# Patient Record
Sex: Female | Born: 1970 | Race: White | Hispanic: No | Marital: Married | Smoking: Current every day smoker
Health system: Southern US, Community
[De-identification: ages and names within clinical notes are randomized; demographics above are authoritative.]

## PROBLEM LIST (undated history)

## (undated) DIAGNOSIS — I10 Essential (primary) hypertension: Secondary | ICD-10-CM

## (undated) DIAGNOSIS — G8929 Other chronic pain: Secondary | ICD-10-CM

## (undated) DIAGNOSIS — F1110 Opioid abuse, uncomplicated: Secondary | ICD-10-CM

## (undated) DIAGNOSIS — M549 Dorsalgia, unspecified: Secondary | ICD-10-CM

## (undated) HISTORY — PX: ABDOMINAL HYSTERECTOMY: SHX81

---

## 2003-01-09 ENCOUNTER — Encounter: Payer: Self-pay | Admitting: *Deleted

## 2003-01-09 ENCOUNTER — Ambulatory Visit (HOSPITAL_COMMUNITY): Admission: RE | Admit: 2003-01-09 | Discharge: 2003-01-09 | Payer: Self-pay | Admitting: *Deleted

## 2003-05-13 ENCOUNTER — Ambulatory Visit (HOSPITAL_COMMUNITY): Admission: AD | Admit: 2003-05-13 | Discharge: 2003-05-13 | Payer: Self-pay | Admitting: *Deleted

## 2003-05-18 ENCOUNTER — Inpatient Hospital Stay (HOSPITAL_COMMUNITY): Admission: AD | Admit: 2003-05-18 | Discharge: 2003-05-21 | Payer: Self-pay | Admitting: *Deleted

## 2003-06-18 ENCOUNTER — Ambulatory Visit (HOSPITAL_COMMUNITY): Admission: RE | Admit: 2003-06-18 | Discharge: 2003-06-18 | Payer: Self-pay | Admitting: *Deleted

## 2004-02-19 ENCOUNTER — Ambulatory Visit (HOSPITAL_COMMUNITY): Admission: RE | Admit: 2004-02-19 | Discharge: 2004-02-19 | Payer: Self-pay | Admitting: Family Medicine

## 2004-03-25 ENCOUNTER — Ambulatory Visit (HOSPITAL_COMMUNITY): Admission: RE | Admit: 2004-03-25 | Discharge: 2004-03-25 | Payer: Self-pay | Admitting: Family Medicine

## 2004-04-18 ENCOUNTER — Ambulatory Visit (HOSPITAL_COMMUNITY): Admission: RE | Admit: 2004-04-18 | Discharge: 2004-04-18 | Payer: Self-pay | Admitting: Family Medicine

## 2004-06-15 ENCOUNTER — Emergency Department (HOSPITAL_COMMUNITY): Admission: EM | Admit: 2004-06-15 | Discharge: 2004-06-16 | Payer: Self-pay | Admitting: Emergency Medicine

## 2004-11-01 ENCOUNTER — Ambulatory Visit (HOSPITAL_COMMUNITY): Admission: RE | Admit: 2004-11-01 | Discharge: 2004-11-01 | Payer: Self-pay | Admitting: Family Medicine

## 2004-11-14 ENCOUNTER — Ambulatory Visit (HOSPITAL_COMMUNITY): Admission: RE | Admit: 2004-11-14 | Discharge: 2004-11-14 | Payer: Self-pay | Admitting: Family Medicine

## 2004-11-14 ENCOUNTER — Ambulatory Visit (HOSPITAL_COMMUNITY): Admission: RE | Admit: 2004-11-14 | Discharge: 2004-11-14 | Payer: Self-pay | Admitting: *Deleted

## 2004-12-28 ENCOUNTER — Inpatient Hospital Stay (HOSPITAL_COMMUNITY): Admission: RE | Admit: 2004-12-28 | Discharge: 2004-12-30 | Payer: Self-pay | Admitting: *Deleted

## 2005-06-07 ENCOUNTER — Emergency Department (HOSPITAL_COMMUNITY): Admission: EM | Admit: 2005-06-07 | Discharge: 2005-06-08 | Payer: Self-pay | Admitting: Emergency Medicine

## 2005-07-27 ENCOUNTER — Emergency Department (HOSPITAL_COMMUNITY): Admission: EM | Admit: 2005-07-27 | Discharge: 2005-07-27 | Payer: Self-pay | Admitting: Emergency Medicine

## 2005-08-22 ENCOUNTER — Encounter: Admission: RE | Admit: 2005-08-22 | Discharge: 2005-08-22 | Payer: Self-pay | Admitting: Neurological Surgery

## 2005-12-04 IMAGING — CR DG LUMBAR SPINE COMPLETE 4+V
5 series · 5 of 5 positions shown · non-contrast
Comparison: none

CLINICAL DATA: 32-year-old with low back pain.
 LUMBAR SPINE COMPLETE
 Five views of the lumbar spine show five non rib-bearing lumbar type vertebral bodies.  Normal alignment on the lateral film.  Disk spaces and vertebral bodies are maintained.  Oblique films demonstrate normal facet joints and no pars defects.  There is mild sclerotic change at the SI joints, which could be degenerative changes or may be osteitis condensans ilii.  
 IMPRESSION
 1.  No acute bony findings and normal alignment.
 2.  Mild sclerosis surrounding the SI joints may be due to degenerative disease or osteitis condensans ilii.

[view not recorded (1 of 5)]
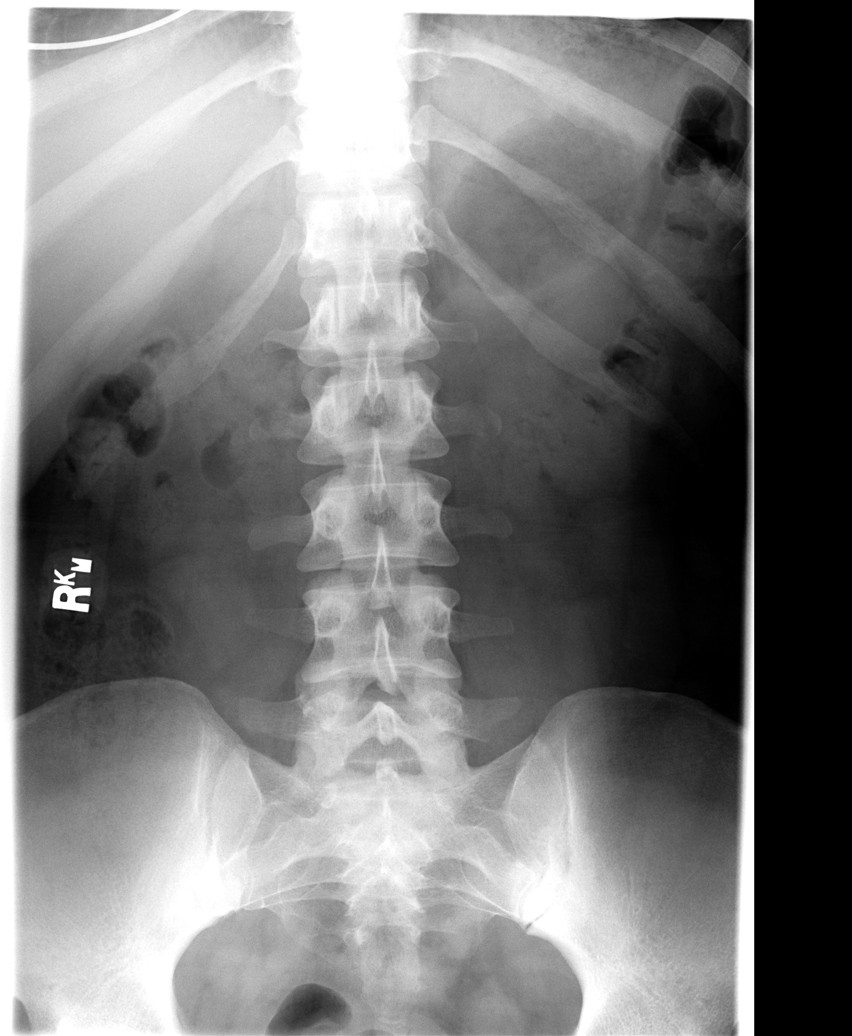

[view not recorded (2 of 5)]
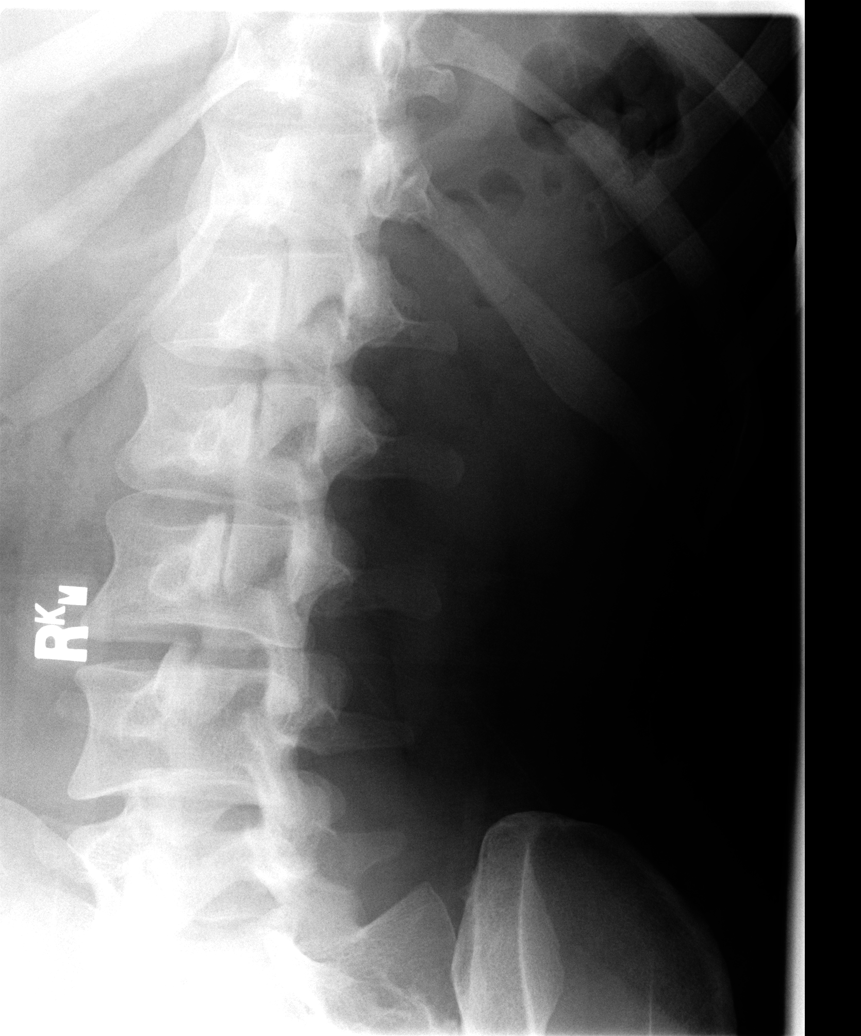

[view not recorded (3 of 5)]
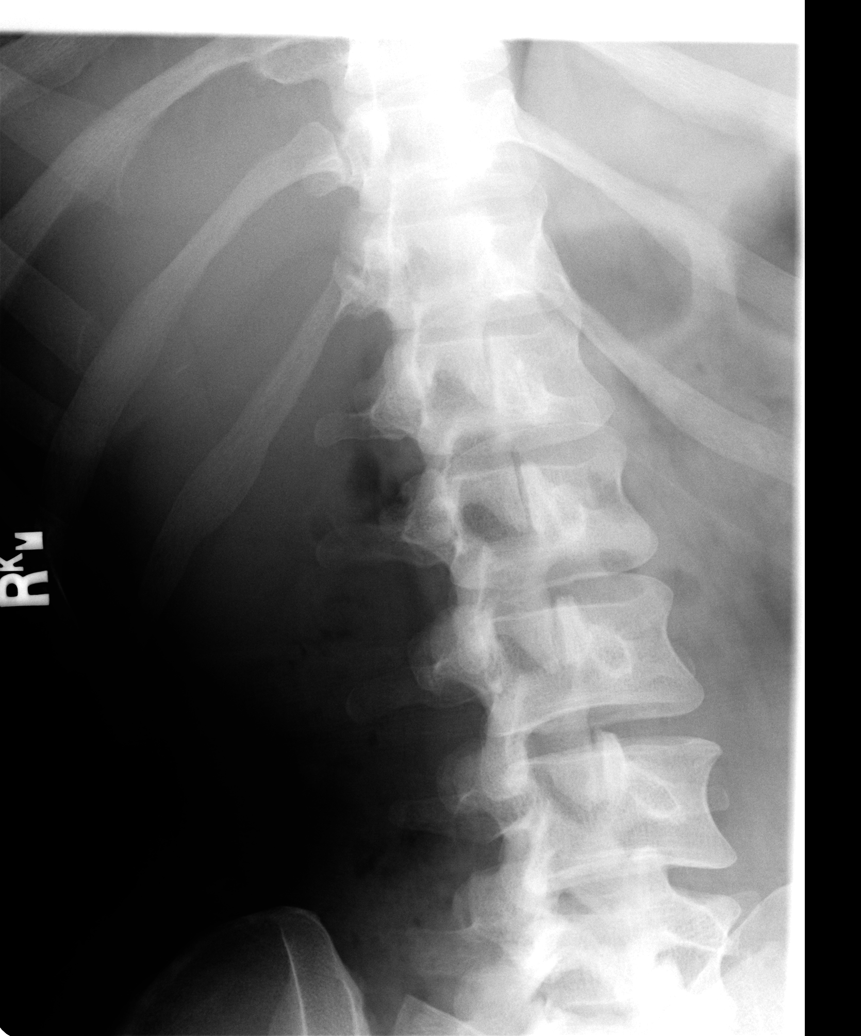

[view not recorded (4 of 5)]
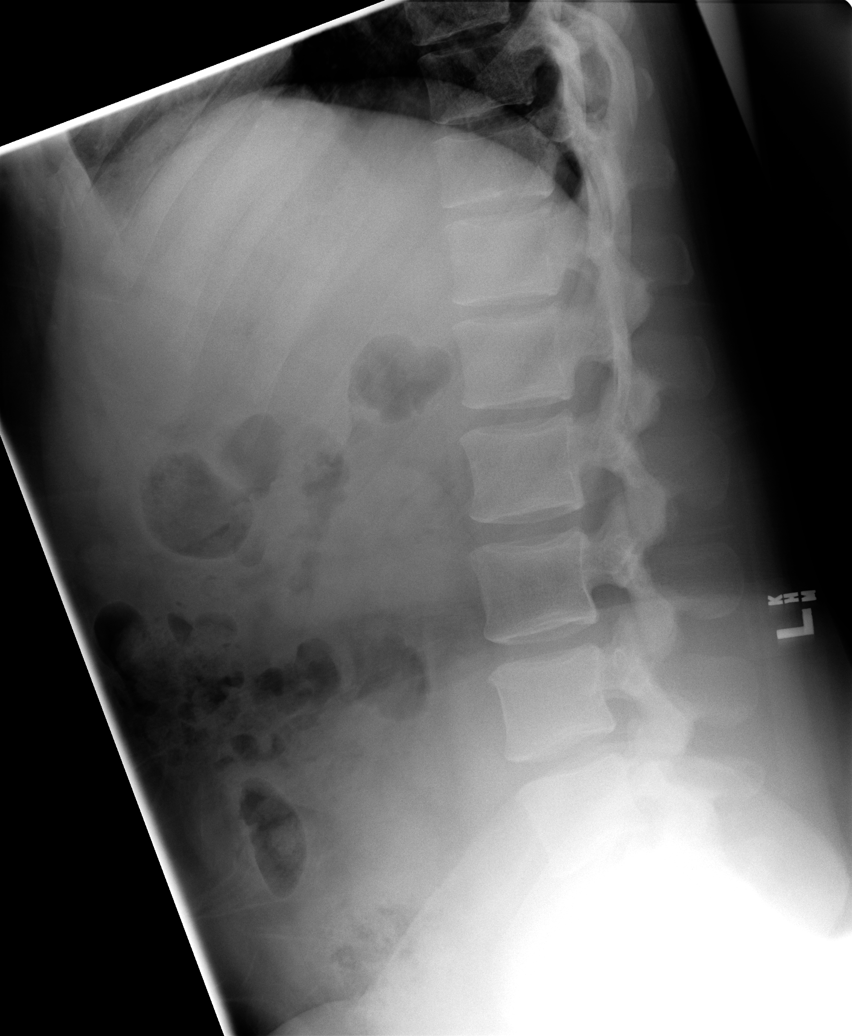

[view not recorded (5 of 5)]
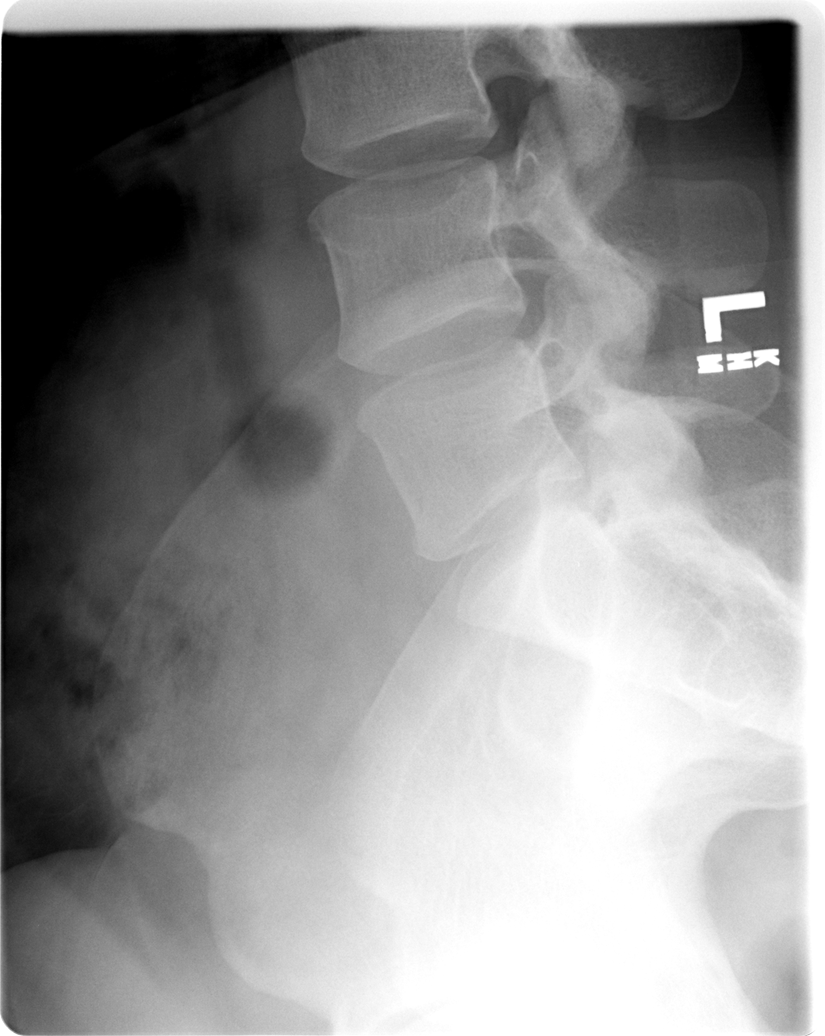

[5 of 5 positions shown; findings below may reference images not displayed]

## 2006-04-24 ENCOUNTER — Encounter (HOSPITAL_COMMUNITY): Admission: RE | Admit: 2006-04-24 | Discharge: 2006-05-24 | Payer: Self-pay | Admitting: Family Medicine

## 2006-10-19 ENCOUNTER — Emergency Department (HOSPITAL_COMMUNITY): Admission: EM | Admit: 2006-10-19 | Discharge: 2006-10-19 | Payer: Self-pay | Admitting: Emergency Medicine

## 2006-10-30 ENCOUNTER — Ambulatory Visit (HOSPITAL_COMMUNITY): Admission: RE | Admit: 2006-10-30 | Discharge: 2006-10-30 | Payer: Self-pay | Admitting: Family Medicine

## 2007-01-11 ENCOUNTER — Emergency Department (HOSPITAL_COMMUNITY): Admission: EM | Admit: 2007-01-11 | Discharge: 2007-01-11 | Payer: Self-pay | Admitting: Emergency Medicine

## 2007-12-15 ENCOUNTER — Emergency Department (HOSPITAL_COMMUNITY): Admission: EM | Admit: 2007-12-15 | Discharge: 2007-12-15 | Payer: Self-pay | Admitting: Emergency Medicine

## 2008-04-04 ENCOUNTER — Emergency Department: Payer: Self-pay | Admitting: Emergency Medicine

## 2008-04-27 ENCOUNTER — Emergency Department: Payer: Self-pay | Admitting: Emergency Medicine

## 2008-04-28 ENCOUNTER — Emergency Department: Payer: Self-pay | Admitting: Internal Medicine

## 2008-04-30 ENCOUNTER — Emergency Department: Payer: Self-pay | Admitting: Emergency Medicine

## 2008-09-21 ENCOUNTER — Emergency Department (HOSPITAL_COMMUNITY): Admission: EM | Admit: 2008-09-21 | Discharge: 2008-09-21 | Payer: Self-pay | Admitting: Emergency Medicine

## 2008-11-06 ENCOUNTER — Emergency Department (HOSPITAL_COMMUNITY): Admission: EM | Admit: 2008-11-06 | Discharge: 2008-11-06 | Payer: Self-pay | Admitting: Emergency Medicine

## 2008-12-13 ENCOUNTER — Emergency Department (HOSPITAL_COMMUNITY): Admission: EM | Admit: 2008-12-13 | Discharge: 2008-12-13 | Payer: Self-pay | Admitting: Emergency Medicine

## 2009-03-21 ENCOUNTER — Emergency Department (HOSPITAL_COMMUNITY): Admission: EM | Admit: 2009-03-21 | Discharge: 2009-03-21 | Payer: Self-pay | Admitting: Emergency Medicine

## 2009-04-14 ENCOUNTER — Emergency Department (HOSPITAL_COMMUNITY): Admission: EM | Admit: 2009-04-14 | Discharge: 2009-04-14 | Payer: Self-pay | Admitting: Emergency Medicine

## 2009-06-06 ENCOUNTER — Emergency Department (HOSPITAL_COMMUNITY): Admission: EM | Admit: 2009-06-06 | Discharge: 2009-06-06 | Payer: Self-pay | Admitting: Emergency Medicine

## 2009-11-06 ENCOUNTER — Emergency Department (HOSPITAL_COMMUNITY): Admission: EM | Admit: 2009-11-06 | Discharge: 2009-11-06 | Payer: Self-pay | Admitting: Emergency Medicine

## 2010-09-21 ENCOUNTER — Emergency Department (HOSPITAL_COMMUNITY): Admission: EM | Admit: 2010-09-21 | Discharge: 2010-09-21 | Payer: Self-pay | Admitting: Emergency Medicine

## 2010-12-31 ENCOUNTER — Encounter: Payer: Self-pay | Admitting: *Deleted

## 2010-12-31 ENCOUNTER — Encounter: Payer: Self-pay | Admitting: Family Medicine

## 2011-01-01 ENCOUNTER — Encounter: Payer: Self-pay | Admitting: *Deleted

## 2011-02-22 LAB — COMPREHENSIVE METABOLIC PANEL
ALT: 18 U/L (ref 0–35)
AST: 20 U/L (ref 0–37)
Albumin: 4.2 g/dL (ref 3.5–5.2)
Alkaline Phosphatase: 62 U/L (ref 39–117)
BUN: 5 mg/dL — ABNORMAL LOW (ref 6–23)
CO2: 25 mEq/L (ref 19–32)
Calcium: 9.3 mg/dL (ref 8.4–10.5)
Chloride: 105 mEq/L (ref 96–112)
Creatinine, Ser: 0.71 mg/dL (ref 0.4–1.2)
GFR calc Af Amer: >60 mL/min (ref >60)
GFR calc non Af Amer: >60 mL/min (ref >60)
Glucose, Bld: 90 mg/dL (ref 70–99)
Potassium: 3.7 mEq/L (ref 3.5–5.1)
Sodium: 139 mEq/L (ref 135–145)
Total Bilirubin: 0.6 mg/dL (ref 0.3–1.2)
Total Protein: 7.9 g/dL (ref 6.0–8.3)

## 2011-02-22 LAB — DIFFERENTIAL
Basophils Absolute: 0 10*3/uL (ref 0.0–0.1)
Basophils Relative: 0 % (ref 0–1)
Eosinophils Absolute: 0.2 10*3/uL (ref 0.0–0.7)
Eosinophils Relative: 2 % (ref 0–5)
Lymphocytes Relative: 18 % (ref 12–46)
Lymphs Abs: 2.3 10*3/uL (ref 0.7–4.0)
Monocytes Absolute: 0.6 10*3/uL (ref 0.1–1.0)
Monocytes Relative: 5 % (ref 3–12)
Neutro Abs: 9.8 10*3/uL — ABNORMAL HIGH (ref 1.7–7.7)
Neutrophils Relative %: 75 % (ref 43–77)

## 2011-02-22 LAB — CBC
HCT: 40.1 % (ref 36.0–46.0)
Hemoglobin: 13.2 g/dL (ref 12.0–15.0)
MCH: 31.4 pg (ref 26.0–34.0)
MCHC: 33 g/dL (ref 30.0–36.0)
MCV: 95 fL (ref 78.0–100.0)
Platelets: 217 10*3/uL (ref 150–400)
RBC: 4.22 MIL/uL (ref 3.87–5.11)
RDW: 14.3 % (ref 11.5–15.5)
WBC: 13.1 10*3/uL — ABNORMAL HIGH (ref 4.0–10.5)

## 2011-02-22 LAB — URINALYSIS, ROUTINE W REFLEX MICROSCOPIC
Bilirubin Urine: NEGATIVE
Glucose, UA: NEGATIVE mg/dL
Hgb urine dipstick: NEGATIVE
Ketones, ur: NEGATIVE mg/dL
Nitrite: NEGATIVE
Protein, ur: NEGATIVE mg/dL
Specific Gravity, Urine: <1.005 — ABNORMAL LOW (ref 1.005–1.030)
Urobilinogen, UA: 0.2 mg/dL (ref 0.0–1.0)
pH: 6 (ref 5.0–8.0)

## 2011-02-22 LAB — URINE CULTURE
Colony Count: 8000
Culture  Setup Time: 201110130156

## 2011-03-15 LAB — CULTURE, ROUTINE-ABSCESS: Gram Stain: NONE SEEN

## 2011-03-21 LAB — COMPREHENSIVE METABOLIC PANEL
ALT: 33 U/L (ref 0–35)
AST: 23 U/L (ref 0–37)
Alkaline Phosphatase: 67 U/L (ref 39–117)
CO2: 22 mEq/L (ref 19–32)
GFR calc Af Amer: >60 mL/min (ref >60)
GFR calc non Af Amer: >60 mL/min (ref >60)
Glucose, Bld: 116 mg/dL — ABNORMAL HIGH (ref 70–99)
Potassium: 3.2 mEq/L — ABNORMAL LOW (ref 3.5–5.1)
Sodium: 135 mEq/L (ref 135–145)
Total Protein: 6.6 g/dL (ref 6.0–8.3)

## 2011-03-21 LAB — RAPID URINE DRUG SCREEN, HOSP PERFORMED
Amphetamines: NOT DETECTED
Benzodiazepines: NOT DETECTED
Tetrahydrocannabinol: NOT DETECTED

## 2011-03-21 LAB — URINALYSIS, ROUTINE W REFLEX MICROSCOPIC
Bilirubin Urine: NEGATIVE
Hgb urine dipstick: NEGATIVE
Ketones, ur: NEGATIVE mg/dL
Protein, ur: NEGATIVE mg/dL
Urobilinogen, UA: 1 mg/dL (ref 0.0–1.0)

## 2011-03-21 LAB — DIFFERENTIAL
Basophils Relative: 0 % (ref 0–1)
Eosinophils Absolute: 0 10*3/uL (ref 0.0–0.7)
Eosinophils Relative: 0 % (ref 0–5)
Monocytes Relative: 5 % (ref 3–12)
Neutrophils Relative %: 87 % — ABNORMAL HIGH (ref 43–77)

## 2011-03-21 LAB — CBC
Hemoglobin: 13.8 g/dL (ref 12.0–15.0)
RBC: 4.12 MIL/uL (ref 3.87–5.11)
WBC: 14 10*3/uL — ABNORMAL HIGH (ref 4.0–10.5)

## 2011-04-28 NOTE — Discharge Summary (Signed)
   NAME:  Molly Munoz, Molly Munoz                        ACCOUNT NO.:  [id number]   MEDICAL RECORD NO.:  [id number]                   PATIENT TYPE:  AMB   LOCATION:  DAY                                  FACILITY:  [facility]   PHYSICIAN:  Langley Gauss, M.D.                DATE OF BIRTH:  Jul 03, 1971   DATE OF ADMISSION:  06/18/2003  DATE OF DISCHARGE:  06/18/2003                                 DISCHARGE SUMMARY   OUTPATIENT SURGERY PERFORMED:  Laparoscopy tubal ligation, lysing bipolar  electrocautery.   DISPOSITION:  The patient; is to followup in the office  in 1 week's time  for postoperative evaluation.  She is given a copy of the  standardized  discharge instructions.   DISCHARGE MEDICATIONS:  She has previously obtained a prescription  preoperatively for Lortab 10/500.   PERTINENT LABORATORY STUDIES:  AB positive blood type, hCG quantitative is 6  which represents the 4 week postpartum state and declining hCG values.  Hemoglobin 12.6, hematocrit 36.9.  Electrolytes within normal limits with  sodium 137, potassium 3.9, chloride 109, CO2 is 25.   HOSPITAL COURSE:  Patient taken to the ambulatory surgical unit on June 18, 2003 with laparoscopic tubal ligation lysing bipolar cautery was performed  without complications.  Postoperatively the patient did well.  She satisfied  all postoperative criteria prior to discharge on the same day of service.  Operative findings discussed with the patient's awaiting husband.                                               Langley Gauss, M.D.    DC/MEDQ  D:  06/21/2003  T:  06/22/2003  Job:  [id number]

## 2011-04-28 NOTE — Op Note (Signed)
   NAMEJANIT, CUTTER NO.:  [id number]   MEDICAL RECORD NO.:  [id number]                  PATIENT TYPE:   LOCATION:                                       FACILITY:   PHYSICIAN:  Langley Gauss, M.D.                DATE OF BIRTH:   DATE OF PROCEDURE:  05/18/2003  DATE OF DISCHARGE:                                 OPERATIVE REPORT   PROCEDURE:  Placement of Foley bulb for mechanical ripening of the cervix  prior to induction.   SURGEON:  Langley Gauss, M.D.   COMPLICATIONS:  None.   SUMMARY:  The planned procedure had been discussed with the patient both  prenatally and again on today's visit.  Nonstress test was reactive prior to  the procedure, with accelerations noted.  No uterine contractions were  identified.  Normal uterine tone.  No leakage of fluid.  No vaginal  bleeding.  The patient was placed in the dorsal lithotomy position.  The  cervix was visualized and noted to be visually only about fingertip dilated.  No abnormal discharge was identified.  A 16 French Foley catheter was then  easily placed through the patent endocervical os and into the lower uterine  segment at which time it was inflated to a volume of 50 cc of sterile normal  saline.  This was then taped to the patient's leg under gentle traction, and  the speculum was then removed.  The patient was again placed on the external  fetal monitor.  The nonstress test remained reactive.  No uterine  contractions were identified.  No change in uterine tone.  No leakage of  fluid and no vaginal bleeding.   Just prior to placement of the Foley, the patient's blood pressure was noted  to be 162/103 which was consistent with the indication for induction despite  the unfavorable nature of the cervix.                                               Langley Gauss, M.D.    DC/MEDQ  D:  05/18/2003  T:  05/18/2003  Job:  [id number]

## 2011-04-28 NOTE — Op Note (Signed)
NAME:  BRENYA, TAULBEE                        ACCOUNT NO.:  [id number]   MEDICAL RECORD NO.:  [id number]                   PATIENT TYPE:  INP   LOCATION:  A418                                 FACILITY:  [facility]   PHYSICIAN:  Langley Gauss, M.D.                DATE OF BIRTH:  Jul 15, 1971   DATE OF PROCEDURE:  05/18/2003  DATE OF DISCHARGE:                                 OPERATIVE REPORT   DELIVERY NOTE:   DIAGNOSES:  1. A 38-week intrauterine pregnancy  2. History of hypertension.   DELIVERY PERFORMED:  Spontaneous assisted vaginal delivery of a 4 pound 12  ounce growth retarded female infant delivered over an intact perineum.  Delivery performed by Dr. Roylene Reason. Lisette Grinder.   ESTIMATED BLOOD LOSS:  Less than 500 cc.   COMPLICATIONS:  None.   SPECIMENS:  1. Arterial cord gas and cord blood to pathology laboratory.  2. Placenta is examined and noted to be intake with a 3-vessel umbilical     cord.  A thin umbilical cord is noted, no gross abnormalities noted of     the placenta itself.   ANALGESIC:  Continuous lumbar epidural analgesia placed by Dr. Roylene Reason.  Lisette Grinder and managed throughout the course of labor.   SUMMARY:  The patient is admitted for induction of labor.  Due to the  unfavorable of the cervix a Foley bulb was placed initially for cervical  ripening of the cervix.  With onset of active uterine contractions after  amniotomy the patient requested epidural.  Epidural functioned very well  throughout the labor course. However, the patient thereafter progressed very  rapidly to reach complete dilatation.   She was placed in the dorsal lithotomy position.  She had a short second  stage of labor to deliver in a direct OA position over the intact perineum.  The mouth and nares were bulb suctioned of clear amniotic fluid.  Renewed  expulsive efforts resulted in a spontaneous rotation to a right anterior  shoulder position.  Gentle downward traction resulted in delivery  of the  shoulder as well as the remainder of the infant without difficulty.  The  umbilical cord is milked towards the infant.  The cord is doubly clamped,  and cut.  The infant is taken to the nursing table for immediate assessment.  A spontaneous, vigorous breathing cry is noted.  Arterial cord gases and  cord blood are obtained from the umbilical cord.  Gentle traction on the  umbilical cord results in separation which, upon examination, is intact 3-  vessel placenta.   Examination following delivery reveals an intact perineum.  No lacerations  have occurred and excellent uterine tone is achieved. The patient is taken  out of dorsal lithotomy position and rolled to her side, at which time, the  epidural catheter is removed with the blue tip noted to be intact.  Langley Gauss, M.D.    DC/MEDQ  D:  05/18/2003  T:  05/19/2003  Job:  [id number]

## 2011-04-28 NOTE — Op Note (Signed)
   NAME:  Molly Munoz, Molly Munoz                        ACCOUNT NO.:  [id number]   MEDICAL RECORD NO.:  [id number]                   PATIENT TYPE:  INP   LOCATION:  A418                                 FACILITY:  [facility]   PHYSICIAN:  Langley Gauss, M.D.                DATE OF BIRTH:  08-Oct-1971   DATE OF PROCEDURE:  05/18/2003  DATE OF DISCHARGE:                                 OPERATIVE REPORT   PROCEDURE:  Placement of continuous lumbar epidural analgesia at the L2-3  interspace performed by Dr. Roylene Reason. Lisette Grinder.   COMPLICATIONS:  None.   SPECIMENS:  None.   SUMMARY:  An appropriate informed consent was obtained, patient placed in  the seated position, bony landmarks were identified. The L3-4 interspace was  chosen. The patient's back was sterilely prepped and draped utilizing the  epidural tray. 5 cc of 1% lidocaine plain injected at the midline of the L3-  4 interspace through a small skin wheal. The 17 gauge Tuohy-Schiff needle  was then utilized with loss of resistance and an air filled syringe to  identify entry into the epidural space on the first attempt without  difficulty. Initial test dose of 5 cc 1.5% lidocaine plus epinephrine  injected through the epidural needle, no signs of CSF or intravascular  injection obtain. The epidural catheter was then inserted to a depth of 5  cm, epidural needle was removed, aspiration test was negative. A second test  dose of 2 ml 1.5% lidocaine plus epinephrine injected through the epidural  catheter, again no signs of CSF or intravascular injection obtained. The  patient is having tingling in the feet consistent with a properly setting  epidural block. The patient is then connected to the infusion pump  containing the standard mixture. She will be treated with a bolus of 10 cc  followed by a continuous infusion of about 18 cc/hour. Upon return to the  lateral supine position, the patient is noted to continue to have a  reassuring fetal heart  rate, cervical examination 4 cm dilated, completed  effaced with the vertex at a plus 1 station continuing with a reassuring  fetal heart rate. The patient does have evidence at this time of a  functional bilateral block and thus should have adequate labor analgesia.                                               Langley Gauss, M.D.    DC/MEDQ  D:  05/18/2003  T:  05/18/2003  Job:  [id number]

## 2011-04-28 NOTE — Op Note (Signed)
Molly Munoz, Molly Munoz              ACCOUNT NO.:  [id number]   MEDICAL RECORD NO.:  [id number]          PATIENT TYPE:  INP   LOCATION:  A410                          FACILITY:  [facility]   PHYSICIAN:  Langley Gauss, MD     DATE OF BIRTH:  Jan 04, 1971   DATE OF PROCEDURE:  DATE OF DISCHARGE:                                 OPERATIVE REPORT   PREOPERATIVE DIAGNOSES:  1.  Menometrorrhagia.  2.  Dysmenorrhea.  3.  Bilateral ovarian cysts with bilateral adnexal pain.   POSTOPERATIVE DIAGNOSES:  1.  Menometrorrhagia.  2.  Dysmenorrhea.  3.  Bilateral ovarian cysts with bilateral adnexal pain.   PROCEDURE:  Total abdominal hysterectomy, bilateral salpingo-oophorectomy.   SURGEON:  Roylene Reason. Lisette Grinder, MD   ESTIMATED BLOOD LOSS:  200 mL.   ANALGESIA:  General endotracheal.   SPECIMEN:  For permanent section only.   DRAINS:  Two JP drains are placed in the subcutaneous space, a Foley  catheter sterilely draining clear yellow urine.   SUMMARY:  An appropriate informed consent was obtained.  I discussed the  planned procedure with the patient immediately preoperatively in the holding  area. The is then taken to the operating room.  Vital signs are stable.  She  underwent uncomplicated induction of general anesthesia.  She is prepped and  draped in the usual sterile manner.  Foley catheter is sterilely placed to  straight drainage, clear yellow urine is noted.  After the abdominal area is  prepped, a knife is used to incise an elliptical-shaped Pfannenstiel  incision; dissected down to the fascial plane utilizing the knife and  cauterizing bleeders along the way.   The fascia is then incised in a transverse curvilinear manner while  dissecting off the underlying rectus muscles.  Rectus fascia is then grasped  with straight Kocher clamps. The fascia is dissected off the underlying  rectus muscle in the midline, superiorly and inferiorly.  The rectus muscles  are bluntly separated.  The  peritoneal cavity is atraumatically and bluntly  entered at the superior most portion of the incision.  Peritoneal incision  extended superiorly and inferiorly; inferiorly I directly palpated the  bladder to avoid its accidental injury.   A Balfour self-retaining retractor is placed.  Blades are noted to be  somewhat deep and 2 packs are utilized to elevate the Balfour retractor to  avoid the blades directing on the femoral nerve.  Two moist packs are then  placed to mobilize bowel out of the pelvis.  Bladder blades, also, were  placed.  The specimen was identified.  It was noted to be freely mobile.  There was a prominent fundal portion to the uterus.  The ovaries are noted  to be slightly enlarged bilaterally.  There is evidence of prior tubal  ligation.  No adhesive disease is encountered.  Long straight Kocher clamps  were used to grasp the round ligament and fallopian tube where they joined  the uterus.  The right round ligament is ligated first.  Kelly clamp was  placed to control backbleeding, the #0 Vicryl suture in a Heaney fashion was  then placed.  The right round ligament was transected. This allows  skeletization of the infundibulopelvic ligament in the avascular plane.  After skeletization, 2 Kelly clamps were placed very close to the ovary  itself.  Transection was performed between the two.  The infundibulopelvic  ligament was then doubly ligated first with a #0 Vicryl free-tie, followed  by suture ligature of #0 Vicryl in a Heaney fashion.  This secures  hemostasis.  Identical procedure is performed on the left.  The left round  ligament is transected with #0 Vicryl suture in a Heaney fashion.  The  infundibulopelvic ligament, on the left, is skeletonized; it is doubly  clamped and then doubly ligated first with a free-tie of #0 Vicryl followed  by suture ligature of #0 Vicryl in a Heaney fashion.  This likewise secures  hemostasis.   We then continued the dissection  across the anterior lower uterine segment  which allows skeletization of each of the uterine vessels and creates a  bladder flap from the vesicouterine folds.  This is then, bluntly separated  off of the anterior lower uterine segment.  The right uterine vessel is  ligated first.  A Kelly clamp was placed to control backbleeding.  A curved  Heaney clamp was then placed at the junction of the body of the uterus with  the cervix itself.  This pedicle was then ligated with a #0 Vicryl suture to  secure hemostasis.  The left uterine vessel is likely clamped with a curved  Heaney clamp, followed by a suture ligature of #0 Vicryl.  This now secures  the major vascular supply to the uterus bilaterally and significant  blanching is noted to occur due to devascularization of the fundal portion  of the uterus.  The right cardinal ligament is identified.  A straight  Heaney clamp is placed here, placing it very close to the cervix as  possible.  It is then ligated with #0 Vicryl in a Heaney fashion.  The left  cardinal ligament, likewise, clamped with a straight Heaney clamp followed  by #0 Vicryl suture in a Heaney fashion.   This then brought me down to the uterosacral ligaments.  The right  uterosacral ligament was clamped with a curved Heaney clamp followed by  suture ligature of #0 Vicryl in a Heaney fashion.  This was secured. This  pedicle was then tagged for a later inspection.  The left ureterosacral  ligament is likewise clamped with a curved Heaney clamp followed by suture  ligature of #0 Vicryl in a Heaney fashion.  This suture is also tagged for  later inspection.  At his point in time, I was able to dissect across the  vaginal cuff maintaining maximal depth of vagina while removing the entirety  of the cervix.   The specimen is then removed.  The cervix is examined.  The cervix is noted  to be contained in its entirety with the operative specimen.  The edges of the of the vaginal cuff  were then grasped with straight Kocher clamps.  Irrigation of the upper vagina is performed with a Betadine solution.  The  vaginal cuff is then closed with figure-of-eight sutures of #0 Vicryl, 3 are  placed.  This secures hemostasis and results in a very secure, and well-  supported closure.  At this point in time, all of our pedicles are examined.  There is a small amount of bleeding occurring from the free peritoneal edges  between each of the round ligaments  and the vaginal cuff.  A running 2-0  Vicryl suture is placed across the vaginal cuff to pull the bladder flap  over our suture line.  This, likewise, assists with hemostasis.  On each  side, then, a 2-0 Vicryl suture is placed with very superficial stitches  from each of the round ligaments to the vaginal cuff.  Again, all sutures  are placed, very superficially, to avoid any ureteral injury.  Hemostasis is  now assured.  Copious irrigation is performed until clear.  The ureters are  noted to be in their normal anatomic position along the lateral pelvic side  walls, thus the closure is performed.   The Balfour retractor is removed as well as the moist packs.  The peritoneal  edges are grasped using Kelly clamp. The peritoneum and overlying rectus  muscle are then closed with a continuous running #0 Vicryl suture.  The  subcutaneous bleeders are cauterized.  The fascia is then closed with a  continuous running #0 looped PDS suture.  The subcutaneous bleeders are  cauterized.  Two JP drains are placed in the subcutaneous space; one to the  right and the other to the left of the uterine incision.  These are sutured  into place with separate sutures with subcutaneous hemostasis assured, 3  horizontal mattress sutures of #1 PDS are placed which help with  reapproximation of the skin edges.  The skin is then completely closed  utilizing skin staples.  To facilitate postoperative analgesia a total of 30  mL of 0.5% bupivacaine plain is  injected along the skin incision to raise  small skin wheals.  The procedure is then terminated.  Vital signs have  remained stable.  The patient continues to drain clear yellow urine.  She is  extubated, taken to the recovery room in stable condition, at which time  operative findings discussed with the patient's awaiting family.      DC/MEDQ  D:  12/28/2004  T:  12/28/2004  Job:  [id number]

## 2011-04-28 NOTE — Discharge Summary (Signed)
NAMEAVO, SCHLACHTER NO.:  [id number]   MEDICAL RECORD NO.:  [id number]          PATIENT TYPE:  INP   LOCATION:  A410                          FACILITY:  [facility]   PHYSICIAN:  Langley Gauss, MD     DATE OF BIRTH:  August 23, 1971   DATE OF ADMISSION:  12/28/2004  DATE OF DISCHARGE:  01/20/2006LH                                 DISCHARGE SUMMARY   PROCEDURES PERFORMED:  December 28, 2004 total abdominal  hysterectomy/bilateral salpingo-oophorectomy.   DISCHARGE MEDICATIONS:  Tylox and Climara patch 0.1 mg daily.   Patient is given copy of standardized discharge instructions.  Follow up in  the office in four days' time for removal of staples from Pfannenstiel  incision.   LABORATORIES:  Admission hemoglobin and hematocrit 11.4/34.2 with a white  count of 11.0.  Postoperative day #1 10.9/32.1 with a white count of 12.1.   HOSPITAL COURSE:  See previous dictations.  TAH/BSO performed December 28, 2004 without complications.  Postoperatively the patient did well.  She  remained afebrile.  Advanced her diet.  Tolerated a regular general diet.  At time of discharge normal bowel function.  Doing well with PCA Dilaudid  which was then converted to p.o. Tylox.  In addition, a Climara patch  adhered well to the patient.  Thus, following uncomplicated postoperative  course patient discharged to home on December 30, 2004.      DC/MEDQ  D:  01/09/2005  T:  01/09/2005  Job:  [id number]

## 2011-04-28 NOTE — H&P (Signed)
   NAME:  Molly Munoz, Molly Munoz                        ACCOUNT NO.:  [id number]   MEDICAL RECORD NO.:  [id number]                   PATIENT TYPE:  AMB   LOCATION:  DAY                                  FACILITY:  [facility]   PHYSICIAN:  Langley Gauss, M.D.                DATE OF BIRTH:  June 13, 1971   DATE OF ADMISSION:  06/18/2003  DATE OF DISCHARGE:                                HISTORY & PHYSICAL   HISTORY OF PRESENT ILLNESS:  A 40 year old gravida 3, para 2 referred to  outpatient surgical unit for a laparoscopic tubal ligation and __________  cautery to be performed on June 18, 2003.  The patient is aware that this is  to be considered an irreversible procedure.  She is also aware of the  inherent 98% success, implicating a 2% failure rate within the first 10  years.  She, likewise, is aware that there are other permanent forms of  birth control available, but, at this time, she adamantly desires permanent  and irreversible sterilization.   PAST MEDICAL AND SURGICAL HISTORY:  1. In 1991, spontaneous AB at [redacted] weeks gestation.  2. In July 1993, vaginal delivery of a 6 pound 3.5 ounce infant.  3. In July 2004, vaginal delivery of a 12 pound 12 ounce infant.   She does smoke 2 packs per day.  Currently bottlefeeding in a postpartum  state.   ALLERGIES:  She has no known drug allergies.   She does have chronic hypertension for which she takes Diovan 160/12.5  daily.   PHYSICAL EXAMINATION:  GENERAL:  In no acute distress.  VITAL SIGNS:  Height 5 feet 7 inches, 189, 142/84, pulse 80, respiratory  rate 20.  HEENT:  Negative.  No adenopathy.  NECK:  Supple.  Thyroid is not palpable.  LUNGS:  Clear.  CARDIOVASCULAR:  Regular rate and rhythm.  ABDOMEN:  Soft and nontender.  No surgical scars are identified.  No  abdominal or pelvic masses appreciated.  EXTREMITIES:  Noted to be normal.  PELVIC:  Normal external genitalia.  No lesions or ulcerations identified.  Cervix is without lesions.   Uterus noted to be multiparous in size,  anteflexed, with no adnexal masses.  Uterus is fully mobile.   ASSESSMENT:  A 40 year old gravida 3, para 2, 4 weeks postpartum who  adamantly desires current sterilization to be performed on June 18, 2003 on  an outpatient basis.                                               Langley Gauss, M.D.    DC/MEDQ  D:  06/18/2003  T:  06/18/2003  Job:  [id number]

## 2011-04-28 NOTE — Op Note (Signed)
NAME:  Molly Munoz, Molly Munoz                        ACCOUNT NO.:  [id number]   MEDICAL RECORD NO.:  [id number]                   PATIENT TYPE:  AMB   LOCATION:  DAY                                  FACILITY:  [facility]   PHYSICIAN:  Langley Gauss, M.D.                DATE OF BIRTH:  Dec 19, 1970   DATE OF PROCEDURE:  DATE OF DISCHARGE:  06/18/2003                                 OPERATIVE REPORT   PREOPERATIVE DIAGNOSIS:  Desires permanent sterilization.   POSTOPERATIVE DIAGNOSIS:  Desires permanent sterilization.   PROCEDURE:  Laparoscopic tubal ligation using bipolar cautery.   SURGEON:  Langley Gauss, M.D.   ANESTHESIA:  General endotracheal   SPECIMENS:  None   ESTIMATED BLOOD LOSS:  None   FINDINGS AT TIME OF SURGERY:  A slightly enlarged, multiparous size uterus;  normal ovaries, bilaterally; normal fallopian tubes identified.  With  traction on the cervix, following the procedure there is noted to be only a  moderate descensus of the uterus.   DESCRIPTION OF PROCEDURE:  The patient was taken to the operating room and  vital signs were stable.  The patient underwent uncomplicated induction of  general endotracheal anesthesia.  She is then placed in the low lithotomy  position; prepped and draped in the usual sterile manner.  A red rubber  catheter is used to drain about 100 cc of clear yellow urine from the  bladder.  A speculum is then placed in the vaginal vault.  The cervix was  visualized without lesions.  The anterior of the lip was grasped with a  single-tooth tenaculum.  A Hulka intrauterine manipulator was then passed  through the endocervical os and used to clamp the cervix at 12 o'clock.  I  then changed to sterile gloves.   Standing at the patient's left side, a 1 cm vertical incision was made just  inferior to the umbilicus as well as a 1 cm suprapubic horizontal incision.  The abdominal wall was elevated.  The Veress needle was passed through the  subumbilical  incision while elevating the abdominal wall. This was done  atraumatically.  D drop test then confirms a proper intraperitoneal  placement with no apparent bowel or vascular injury.  Three liters of CO2  gas was then insufflated at a filling pressure of less than 15 mmHg.  After  assurance of adequate pneumoperitoneum the Veress needle was removed.  The  abdominal wall was again elevated.  The disposable 10 mm trocar and sheath  are inserted through the subumbilical incision angling towards the hollow of  the sacrum.  This was done atraumatically.  The trocar was removed.  The  scope was inserted which confirms proper intraperitoneal placement; no  apparent bowel or vascular injury.   The pelvic contents were visualized.  The patient placed in the  Trendelenburg position.  A 5 mm trocar was inserted under direct  visualization, suprapubically.  This was  done atraumatically.  This allows  the Kleppinger, bipolar cauterization forceps to be passed through the  suprapubic incision.  The fallopian tubes are then identified.  Each is  traced to its fimbriated end to confirm its identity.  Three cauterization  technique is then performed on each tube with the most proximal being 1 cm  from the tubal uterine junction. Each of the 3 cauterization performed was  on direct continuity with the previous.  Cauterization is continued until  the LED power lights plateaued down at 2. After assurance of adequate  tubular destruction and destruction of at least 3 cm of fallopian tube now  on both the right and the left the procedure was then terminated.  Our  instruments are removed and gas was allowed to escape.   The incision sites were then closed utilizing 2-0 Vicryl suture through-and-  through the subcutaneous Scarpa's fascia.  Band-aids were then placed over  the skin sites.  A total of 10 cc of 0.5% bupivacaine pain is injected at  the incision sites to raise a small skin wheel.  I then removed the  Hulka  tenaculum from the cervix.  The patient was reversed of anesthesia and taken  to the recovery room in stable condition.                                               Langley Gauss, M.D.    DC/MEDQ  D:  06/21/2003  T:  06/22/2003  Job:  [id number]

## 2011-04-28 NOTE — H&P (Signed)
NAME:  Molly Munoz, Molly Munoz                        ACCOUNT NO.:  [id number]   MEDICAL RECORD NO.:  [id number]                   PATIENT TYPE:  INP   LOCATION:  A418                                 FACILITY:  [facility]   PHYSICIAN:  Langley Gauss, M.D.                DATE OF BIRTH:  22-Jan-1971   DATE OF ADMISSION:  05/18/2003  DATE OF DISCHARGE:                                HISTORY & PHYSICAL   HISTORY:  This is a 40 year old gravida 3, para 1 at 38+ weeks gestation who  is admitted for induction of labor.  The patient's prenatal course was  complicated by a history of prior hypertension.  The patient had been seen  initially October 30, 2002 for onset of prenatal care at which time she is  noted to provide a history of chronic hypertension x1 year duration.  The  patient was treated with Diovan 160 mg p.o. daily which is an ACE inhibitor.  She has been under the care of Dr. Gerda Diss.  The patient discontinued Diovan  October 30, 2002 and presented to our office for pregnancy care.  At that  time, with the history of previously diagnosed chronic hypertension the  patient was treated with labetalol 100 mg p.o. b.i.d.; however, the patient  discontinued the labetalol upon her own accord November 12, 2002 stating that  it gave her headaches.  She was subsequently, thereafter, managed  expectantly during the pregnancy for evaluation of blood pressure which  essentially remained normalized with findings such as a blood pressure of  130/72 at [redacted] weeks gestation, 123/65 at [redacted] weeks gestation.  The patient  continued to smoke 1/2 pack a day.  Thus the question of hypertension  remained open.  The patient was not followed with serial nonstress tests due  to the normalization of blood pressure without any antihypertensive  medications required.  However, with continuing care on May 13, 2003 at 37-  5/[redacted] weeks gestation the patient is now noted to have blood pressure  increasing to 140/88 at which time  she is managed as a hypertensive and  referred to labor and delivery at which time she is noted to have a reactive  nonstress test and subsequent induction of labor scheduled for May 19, 2003.   PAST MEDICAL HISTORY:  See prior previous history of hypertension.   OBSTETRICAL HISTORY:  1. July 05, 1992 a vaginal delivery of a 6 pound 3-1/2 ounce infant.  2. 1991 a spontaneous AB at [redacted] weeks gestation.   CURRENT MEDICATIONS:  Include only prenatal vitamins.   PHYSICAL EXAMINATION:  GENERAL:  The patient in no acute distress.  VITAL SIGNS:  Height 5 feet 7 inches.  Prepregnancy weight is 195, most  recent weight 203.  Blood pressure 140/88, pulse rate of 80, and respiratory  rate is 20.  HEENT:  Negative.  NECK:  No adenopathy.  Neck is supple.  Thyroid is not palpable.  LUNGS:  Clear.  CARDIOVASCULAR:  Regular rate and rhythm.  ABDOMEN:  The abdomen is soft and nontender.  No surgical scars are  identified.  The patient is vertex presentation by Leopold's maneuvers.  EXTREMITIES:  Normal with only a trace pretibial edema.  PELVIC:  Normal external genitalia.  No lesions or ulcerations identified.  No leakage of fluid.  No vaginal bleeding.  Cervix fingertip dilated.   ASSESSMENT:  Patient with a previous history of chronic hypertension.  Was  not treated with any antihypertensives during the pregnancy and the blood  pressure remained normal with the exception of during this past week only.  Thus, the patient is to be managed, at this time, with diagnosis of  gestational hypertension; be admitted on May 18, 2003 for medically  indicated induction of labor.  However, due to the patient's unfavorable  cervical status we will proceed initially with placement of Foley bulb for  mechanical ripening. After which time hopefully this can be removed and  amniotomy performed.  The patient would desire permanent sterilization.  She  does plan on bottle feedings and pediatrics are to be provided  by [facility].                                               Langley Gauss, M.D.    DC/MEDQ  D:  05/20/2003  T:  05/20/2003  Job:  [id number]

## 2011-04-28 NOTE — Op Note (Signed)
   NAME:  ROCKIE, Molly Munoz                        ACCOUNT NO.:  [id number]   MEDICAL RECORD NO.:  [id number]                   PATIENT TYPE:  INP   LOCATION:  A418                                 FACILITY:  [facility]   PHYSICIAN:  Langley Gauss, M.D.                DATE OF BIRTH:  1971-11-18   DATE OF PROCEDURE:  DATE OF DISCHARGE:                                  PROCEDURE NOTE   PROCEDURE:  Removal of Foley bulb from cervix performed by Dr. Roylene Reason.  Lisette Grinder.   COMPLICATIONS:  None.   SUMMARY:  The patient had the Foley bulb placed at 0900 this a.m.  At 1400  it was removed.  I was unable to remove it intact, thus about 30 cc of the  sterile normal saline was removed which then allowed removal of the Foley  balloon without complete deflation.   The patient was then up to the bathroom to void and empty her bladder.  Upon  return to the bed she is noted to have a reactive nonstress test.  No  uterine contractions identified.  Cervix 3 cm dilated, 80% effaced, minus 1  station, vertex presentation.  Fetal scalp electrode was placed with  resultant amniotomy.  The patient continued to have a reassuring fetal heart  rate.   PLAN:  She will be observed and managed expectantly x1 hour duration; after  which if spontaneous labor is not achieved, the patient will receive Pitocin  induction.  Estimated fetal weight is 7-1/2 pounds.                                               Langley Gauss, M.D.    DC/MEDQ  D:  05/18/2003  T:  05/18/2003  Job:  [id number]

## 2011-04-28 NOTE — Discharge Summary (Signed)
   NAME:  Molly Munoz, Molly Munoz                        ACCOUNT NO.:  [id number]   MEDICAL RECORD NO.:  [id number]                   PATIENT TYPE:  INP   LOCATION:  A418                                 FACILITY:  [facility]   PHYSICIAN:  Langley Gauss, M.D.                DATE OF BIRTH:  [birth year]   DATE OF ADMISSION:  05/18/2003  DATE OF DISCHARGE:  05/21/2003                                 DISCHARGE SUMMARY   DIAGNOSES:  1. A 38-week intrauterine pregnancy for induction.  2. History of chronic hypertension preceding the pregnancy.   PROCEDURES:  1. Foley ball.  2. Epidural.  3. Delivery.   DISPOSITION:  At the time of discharge she was given a copy of the  standarized discharge instructions.  She will followup in the office in  three weeks' time.  She is interested in scheduling permanent sterilization.   CURRENT LABORATORY STUDIES:  RPR is nonreactive.  Hemoglobin 10.9,  hematocrit 30.2 with a white count of 10.7.  Postpartum day #1 - 10.0/28.1.  AB positive blood type with a negative antibody screen.   HOSPITAL COURSE:  See previous dictation.  Patient did have some elevated  blood pressures during the course of labor which were effectively managed  with the use of the epidural.  Postpartum patient did well.  She remained  normotensive during the hospital course, ambulated well.  She had no  excessive vaginal bleeding.  She did ambulate frequently outside to smoke.  The patient subsequent was, therefore, discharged to home, not requiring any  antihypertensive medications to be followed closely on an outpatient basis.                                                Langley Gauss, M.D.    DC/MEDQ  D:  05/22/2003  T:  05/22/2003  Job:  [id number]

## 2011-04-28 NOTE — H&P (Signed)
Molly Munoz, SCHUTTER              ACCOUNT NO.:  [id number]   MEDICAL RECORD NO.:  [id number]          PATIENT TYPE:  AMB   LOCATION:  DAY                           FACILITY:  [facility]   PHYSICIAN:  Langley Gauss, MD     DATE OF BIRTH:  Oct 30, 1971   DATE OF ADMISSION:  12/28/2004  DATE OF DISCHARGE:  LH                                HISTORY & PHYSICAL   PLANNED PROCEDURE:  TAH/BSO.   A 40 year old gravida 3, para 2-0-1-2 with a chief complaint of  metromenorrhagia and dysmenorrhea as well as bilateral adnexal masses, which  on ultrasonographic study are suspicious for dermoid cysts.  The patient  declines a trial of oral contraceptives.  She currently is taking a  combination of Motrin as well as requiring p.o. Tylox for relief of this  dysmenorrhea as well as intermittent adnexal-type pain.  Menstrual periods  are described as fairly regular in period of interval; however, there are 5-  7 days of flow with passage of golf-ball-sized clots.   PAST MEDICAL HISTORY:  Vaginal delivery in 1993 and vaginal delivery in June  2004.  In 1991, spontaneous AB at 12 weeks' gestation.  The patient has  undergone permanent sterilization with laparoscopic tubal ligation utilizing  bipolar cautery, done June 18, 2003.  At time of laparoscopic tubal ligation,  there was noted to be a diffusely enlarged, multiparous-sized uterus with no  significant adnexal masses appreciated and no intrapelvic adhesive disease.  She has no known drug allergies.  Illnesses:  The patient does have chronic  hypertension, currently well maintained on Procardia 60 mg p.o. q.h.s.  She  also has anxiety disorder with Xanax 1.0 mg p.o. t.i.d.  She takes Flexeril  10 mg p.o. t.i.d. for chronic low back pain and also takes Tylox on an  intermittent p.r.n. basis.   PHYSICAL EXAMINATION:  CONSTITUTIONAL:  A one-and-one-half-pack-per-day  smoker.  Height 5-foot-7.  Weight 195.  Blood pressure 144/91, pulse 94,  respiratory rate  16.  HEENT:  Negative.  No adenopathy.  NECK:  Supple.  Thyroid is not palpable.  LUNGS:  Clear.  CARDIOVASCULAR:  Regular in rate and rhythm.  ABDOMEN:  Soft and nontender.  Only laparoscopic tubal ligation scars are  identified.  A small panniculus is present.  PELVIC:  Normal external genitalia.  No lesions or ulcerations identified.  Pelvic examination reveals no urethral detachment.  Only a first-degree  cystocele is present.  Review of systems is pertinent for no history of  stress urinary incontinence.  The cervix is multiparous in size and shape.  Bimanual examination reveals a diffusely enlarged, multiparous uterus.  The  adnexa are moderately tender bilaterally and slightly enlarged.   LABORATORY STUDIES:  Ultrasound obtained at [facility] does reveal diffusely  enlarged uterus.  Bilateral adnexal masses are noted, which are suggestive  of dermoid cysts.  The patient is noted to have a hemoglobin of 11.4 and  hematocrit 34.2 with an MCV of 86.6.  White count is normal at 11.0.  Electrolytes within normal limits.  Random glucose is 118.  Serum HCG is  negative.  Urine drug screen is positive for benzodiazepines and positive  for THC.  I cannot explain why this is not also positive for opiates as the  patient is taking Tylox on a fairly regular basis by reviewing prescription  history.  She is noted to be AB+ blood type, and 1 unit of packed red blood  cells is available.   ASSESSMENT/PLAN:  The patient with incapacitating symptoms at present.  She  does desire definitive surgical management.  She does have some  intermittent, episodic bilateral adnexal pain and does have the abnormal  adnexal studies which are somewhat suggestive of bilateral ovarian dermoids.  Thus, for definitive surgical management, the patient desires a TAH/BSO.  She does understand and accept that removal of the ovaries bilaterally will  result in onset of surgical menopause, which will necessitate the  initiation  of hormonal replacement therapy to avoid the significant onset of menopausal  symptoms.  The risks and benefits of the surgery are discussed also and  include the risk of bowel or bladder injury and blood loss, in rare cases  requiring blood transfusion therapy.  The patient was noted previously to be  anemic.  She has responded very well to iron replacement therapy with a  hemoglobin now of 11.4.      DC/MEDQ  D:  12/28/2004  T:  12/28/2004  Job:  [id number]

## 2011-09-09 ENCOUNTER — Emergency Department (HOSPITAL_COMMUNITY)
Admission: EM | Admit: 2011-09-09 | Discharge: 2011-09-09 | Disposition: A | Discharge status: Home / Self Care | Payer: Self-pay | Attending: Emergency Medicine | Admitting: Emergency Medicine

## 2011-09-09 ENCOUNTER — Encounter: Payer: Self-pay | Admitting: *Deleted

## 2011-09-09 DIAGNOSIS — R51 Headache: Secondary | ICD-10-CM | POA: Insufficient documentation

## 2011-09-09 DIAGNOSIS — Z91199 Patient's noncompliance with other medical treatment and regimen due to unspecified reason: Secondary | ICD-10-CM | POA: Insufficient documentation

## 2011-09-09 DIAGNOSIS — G44209 Tension-type headache, unspecified, not intractable: Secondary | ICD-10-CM

## 2011-09-09 DIAGNOSIS — F17200 Nicotine dependence, unspecified, uncomplicated: Secondary | ICD-10-CM | POA: Insufficient documentation

## 2011-09-09 DIAGNOSIS — Z9119 Patient's noncompliance with other medical treatment and regimen: Secondary | ICD-10-CM | POA: Insufficient documentation

## 2011-09-09 DIAGNOSIS — M542 Cervicalgia: Secondary | ICD-10-CM | POA: Insufficient documentation

## 2011-09-09 DIAGNOSIS — I10 Essential (primary) hypertension: Secondary | ICD-10-CM

## 2011-09-09 HISTORY — DX: Essential (primary) hypertension: I10

## 2011-09-09 MED ORDER — CYCLOBENZAPRINE HCL 10 MG PO TABS: ORAL_TABLET | ORAL | Status: DC

## 2011-09-09 MED ORDER — HYDROCODONE-ACETAMINOPHEN 5-325 MG PO TABS: ORAL_TABLET | ORAL | Status: DC

## 2011-09-09 MED ORDER — CYCLOBENZAPRINE HCL 10 MG PO TABS
10.0000 mg | ORAL_TABLET | Freq: Once | ORAL | Status: AC
  Administered 2011-09-09: 10 mg via ORAL
  Filled 2011-09-09: qty 1

## 2011-09-09 MED ORDER — HYDROCODONE-ACETAMINOPHEN 5-325 MG PO TABS
1.0000 | ORAL_TABLET | Freq: Once | ORAL | Status: AC
  Administered 2011-09-09: 1 via ORAL
  Filled 2011-09-09: qty 1

## 2011-09-09 MED ORDER — IBUPROFEN 800 MG PO TABS
800.0000 mg | ORAL_TABLET | Freq: Once | ORAL | Status: AC
  Administered 2011-09-09: 800 mg via ORAL
  Filled 2011-09-09: qty 1

## 2011-09-09 NOTE — ED Notes (Signed)
Pt c/o pain in head and in neck. Pt states she has been hurting for 2 weeks on and off.

## 2011-09-09 NOTE — ED Notes (Signed)
Pt c/o pain in the back of her neck x 1 week. Pt states that the pain shoots up into her head. C/o worsening pain with movement. Pt alert and oriented x 3. Skin warm and dry. Color pink. Breath sounds clear and equal bilaterally.
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
## 2011-09-10 NOTE — ED Provider Notes (Signed)
Medical screening examination/treatment/procedure(s) were performed by non-physician practitioner and as supervising physician I was immediately available for consultation/collaboration.  Flint Melter, MD 09/10/11 [phone]

## 2011-09-12 LAB — URINALYSIS, ROUTINE W REFLEX MICROSCOPIC
Glucose, UA: NEGATIVE
Ketones, ur: NEGATIVE
Urobilinogen, UA: 0.2

## 2011-09-12 LAB — URINE CULTURE: Colony Count: >=100000

## 2011-09-12 LAB — URINE MICROSCOPIC-ADD ON

## 2011-11-20 ENCOUNTER — Encounter (HOSPITAL_COMMUNITY): Payer: Self-pay

## 2011-11-20 ENCOUNTER — Emergency Department (HOSPITAL_COMMUNITY)
Admission: EM | Admit: 2011-11-20 | Discharge: 2011-11-20 | Disposition: A | Discharge status: Home / Self Care | Payer: Medicaid Other | Attending: Emergency Medicine | Admitting: Emergency Medicine

## 2011-11-20 DIAGNOSIS — F17200 Nicotine dependence, unspecified, uncomplicated: Secondary | ICD-10-CM | POA: Insufficient documentation

## 2011-11-20 DIAGNOSIS — J111 Influenza due to unidentified influenza virus with other respiratory manifestations: Secondary | ICD-10-CM | POA: Insufficient documentation

## 2011-11-20 DIAGNOSIS — I10 Essential (primary) hypertension: Secondary | ICD-10-CM | POA: Insufficient documentation

## 2011-11-20 NOTE — ED Provider Notes (Signed)
History    This chart was scribed for Molly Gaskins, MD scribed by Magnus Sinning. The patient was seen in room APA10/[facility]10   CSN: [id number] Arrival date & time: 11/20/2011  1:31 PM   First MD Initiated Contact with Patient 11/20/11 1502      Chief Complaint  Patient presents with  . Influenza     Patient is a 40 y.o. female presenting with cough. The history is provided by the patient.  Cough This is a new problem. The current episode started yesterday. The problem occurs constantly. The problem has been gradually worsening. The cough is non-productive. Associated symptoms include rhinorrhea and myalgias. Treatments tried: Tylenol and Advil. The treatment provided mild relief.  Pt denies vomiting and  Diarrhea.    Past Medical History  Diagnosis Date  . Hypertension   . Migraine     Past Surgical History  Procedure Date  . Abdominal hysterectomy     No family history on file.  History  Substance Use Topics  . Smoking status: Current Everyday Smoker -- 2.0 packs/day  . Smokeless tobacco: Not on file  . Alcohol Use: No   Review of Systems  HENT: Positive for rhinorrhea.   Respiratory: Positive for cough.   Musculoskeletal: Positive for myalgias.  All other systems reviewed and are negative.    Allergies  Review of patient's allergies indicates no known allergies.  Home Medications   Current Outpatient Rx  Name Route Sig Dispense Refill  . GUAIFENESIN 100 MG/5ML PO LIQD Oral Take 200 mg by mouth 3 (three) times daily as needed. cold     . IBUPROFEN 100 MG/5ML PO SUSP Oral Take 200 mg by mouth every 4 (four) hours as needed. Pain/fever       BP 126/84  Pulse 81  Temp(Src) 98.6 F (37 C) (Oral)  Resp 18  Ht 5\' 7"  (1.702 m)  Wt 180 lb (81.647 kg)  BMI 28.19 kg/m2  SpO2 98%  Physical Exam CONSTITUTIONAL: Well developed/well nourished HEAD AND FACE: Normocephalic/atraumatic EYES: EOMI/PERRL ENMT: Mucous membranes moist, uvula midline, pharynx  normal NECK: supple no meningeal signs CV: S1/S2 noted, no murmurs/rubs/gallops noted LUNGS: Lungs are clear to auscultation bilaterally, no apparent distress ABDOMEN: soft, nontender, no rebound or guarding GU:no cva tenderness NEURO: Pt is awake/alert, moves all extremitiesx4 EXTREMITIES: pulses normal, full ROM SKIN: warm, color normal PSYCH: no abnormalities of mood noted  ED Course  Procedures DIAGNOSTIC STUDIES: Oxygen Saturation is 98% on room air , mormal by my interpretation.    COORDINATION OF CARE:   1. Influenza     I told pt to quit smoking  MDM  Nursing notes reviewed and considered in documentation   I personally performed the services described in this documentation, which was scribed in my presence. The recorded information has been reviewed and considered.          Molly Gaskins, MD 11/21/11 [facility] Manners

## 2011-11-20 NOTE — ED Notes (Signed)
Nasal congestion, body aches for 3 days. Pt appears sick.

## 2011-11-20 NOTE — ED Notes (Signed)
Pt states has been sick since Saturday with cough, general body aches. Pt denies known fever. Pt reports sibling has been sick and was exposed to her a few days ago. Pt states daughter has been sick also.

## 2012-01-29 ENCOUNTER — Emergency Department (HOSPITAL_COMMUNITY)
Admission: EM | Admit: 2012-01-29 | Discharge: 2012-01-29 | Disposition: A | Discharge status: Home / Self Care | Payer: Medicaid Other | Attending: Emergency Medicine | Admitting: Emergency Medicine

## 2012-01-29 ENCOUNTER — Encounter (HOSPITAL_COMMUNITY): Payer: Self-pay | Admitting: *Deleted

## 2012-01-29 DIAGNOSIS — K089 Disorder of teeth and supporting structures, unspecified: Secondary | ICD-10-CM | POA: Insufficient documentation

## 2012-01-29 DIAGNOSIS — K029 Dental caries, unspecified: Secondary | ICD-10-CM | POA: Insufficient documentation

## 2012-01-29 DIAGNOSIS — K0889 Other specified disorders of teeth and supporting structures: Secondary | ICD-10-CM

## 2012-01-29 DIAGNOSIS — I10 Essential (primary) hypertension: Secondary | ICD-10-CM | POA: Insufficient documentation

## 2012-01-29 MED ORDER — LISINOPRIL 20 MG PO TABS: 20.0000 mg | ORAL_TABLET | Freq: Every day | ORAL | Status: DC

## 2012-01-29 MED ORDER — HYDROCODONE-ACETAMINOPHEN 5-325 MG PO TABS: ORAL_TABLET | ORAL | Status: AC

## 2012-01-29 MED ORDER — PENICILLIN V POTASSIUM 500 MG PO TABS: 500.0000 mg | ORAL_TABLET | Freq: Four times a day (QID) | ORAL | Status: AC

## 2012-01-29 NOTE — ED Provider Notes (Signed)
History     CSN: [id number]  Arrival date & time 01/29/12  1338   First MD Initiated Contact with Patient 01/29/12 1348      Chief Complaint  Patient presents with  . Dental Pain    (Consider location/radiation/quality/duration/timing/severity/associated sxs/prior treatment) Patient is a 41 y.o. female presenting with tooth pain. The history is provided by the patient. No language interpreter was used.  Dental PainThe primary symptoms include mouth pain. Primary symptoms do not include dental injury, headaches, fever, shortness of breath, sore throat or angioedema. The symptoms began more than 1 week ago. The symptoms are unchanged. The symptoms are new. The symptoms occur constantly.  Mouth pain began more than 1 week ago. Mouth pain occurs constantly. Mouth pain is unchanged. Affected locations include: gum(s) and teeth. The mouth pain is currently at 10/10.  Additional symptoms include: dental sensitivity to temperature, gum swelling and gum tenderness. Additional symptoms do not include: purulent gums, trismus, facial swelling, trouble swallowing and ear pain. Medical issues include: smoking and periodontal disease.    Past Medical History  Diagnosis Date  . Hypertension   . Migraine     Past Surgical History  Procedure Date  . Abdominal hysterectomy     History reviewed. No pertinent family history.  History  Substance Use Topics  . Smoking status: Current Everyday Smoker -- 2.0 packs/day  . Smokeless tobacco: Not on file  . Alcohol Use: No    OB History    Grav Para Term Preterm Abortions TAB SAB Ect Mult Living                  Review of Systems  Constitutional: Negative for fever and chills.  HENT: Positive for dental problem. Negative for ear pain, sore throat, facial swelling, trouble swallowing, neck pain and neck stiffness.   Respiratory: Negative for shortness of breath.   Cardiovascular: Negative for chest pain.  Gastrointestinal: Negative for nausea  and vomiting.  Musculoskeletal: Negative for myalgias.  Skin: Negative for rash.  Neurological: Negative for dizziness, weakness, numbness and headaches.  Hematological: Does not bruise/bleed easily.  All other systems reviewed and are negative.    Allergies  Review of patient's allergies indicates no known allergies.  Home Medications   Current Outpatient Rx  Name Route Sig Dispense Refill  . GUAIFENESIN 100 MG/5ML PO LIQD Oral Take 200 mg by mouth 3 (three) times daily as needed. cold     . IBUPROFEN 100 MG/5ML PO SUSP Oral Take 200 mg by mouth every 4 (four) hours as needed. Pain/fever       BP 189/112  Pulse 65  Temp(Src) 98.6 F (37 C) (Oral)  Resp 18  Ht 5\' 7"  (1.702 m)  Wt 180 lb (81.647 kg)  BMI 28.19 kg/m2  SpO2 100%  LMP 01/29/2012  Physical Exam  Nursing note and vitals reviewed. Constitutional: She is oriented to person, place, and time. She appears well-developed and well-nourished. No distress.  HENT:  Head: Normocephalic and atraumatic. No trismus in the jaw.  Right Ear: Tympanic membrane and ear canal normal.  Left Ear: Tympanic membrane and ear canal normal.  Mouth/Throat: Uvula is midline, oropharynx is clear and moist and mucous membranes are normal. Dental caries present. No dental abscesses or uvula swelling. No oropharyngeal exudate.    Neck: Normal range of motion. Neck supple.  Cardiovascular: Normal rate, regular rhythm, normal heart sounds and intact distal pulses.   No murmur heard. Pulmonary/Chest: Effort normal and breath sounds normal. No respiratory  distress.  Musculoskeletal: Normal range of motion. She exhibits no edema and no tenderness.  Lymphadenopathy:    She has no cervical adenopathy.  Neurological: She is alert and oriented to person, place, and time. She exhibits normal muscle tone. Coordination normal.  Skin: Skin is warm and dry.    ED Course  Procedures (including critical care time)       MDM    Patient has  multiple dental caries.  tenderness to palpation of the left upper first molar and surrounding gums. No edema facial swelling or trismus.  No obvious abscess at this time.  She has an appointment with her dentist in 2 weeks.   Patient also has an elevated blood pressure today she states she has a history of hypertension but has an out of her lisinopril for quite some time and she is no medical doctor.  I have advised her that she can followup with the health department regarding her hypertension.   I will refill her lisinopril today.  She denies any dizziness headache visual changes chest pain or dyspnea today  Patient / Family / Caregiver understand and agree with initial ED impression and plan with expectations set for ED visit. Pt stable in ED with no significant deterioration in condition.   Sasuke Yaffe L. [place], [place] 02/01/12 1612

## 2012-01-29 NOTE — Discharge Instructions (Signed)
Dental Pain     Toothache is pain in or around a tooth. It may get worse with chewing or with cold or heat.   HOME CARE  · Your dentist may use a numbing medicine during treatment. If so, you may need to avoid eating until the medicine wears off. Ask your dentist about this.   · Only take medicine as told by your dentist or doctor.   · Avoid chewing food near the painful tooth until after all treatment is done. Ask your dentist about this.   GET HELP RIGHT AWAY IF:   · The problem gets worse or new problems appear.   · You have a fever.   · There is redness and puffiness (swelling) of the face, jaw, or neck.   · You cannot open your mouth.   · There is pain in the jaw.   · There is very bad pain that is not helped by medicine.   MAKE SURE YOU:   · Understand these instructions.   · Will watch your condition.   · Will get help right away if you are not doing well or get worse.   Document Released: 05/15/2008 Document Revised: 08/09/2011 Document Reviewed: 05/15/2008  [employer]® Patient Information ©2012 [employer].

## 2012-01-29 NOTE — ED Notes (Signed)
Pain lt upper jaw,x1 week, says she has a "bad tooth"

## 2012-01-31 NOTE — ED Provider Notes (Signed)
Medical screening examination/treatment/procedure(s) were performed by non-physician practitioner and as supervising physician I was immediately available for consultation/collaboration.  Nicholes Stairs, MD 01/31/12 [phone]

## 2012-02-01 NOTE — ED Provider Notes (Signed)
Medical screening examination/treatment/procedure(s) were performed by non-physician practitioner and as supervising physician I was immediately available for consultation/collaboration.  Nicholes Stairs, MD 02/01/12 [phone]

## 2012-02-21 ENCOUNTER — Emergency Department (HOSPITAL_COMMUNITY)
Admission: EM | Admit: 2012-02-21 | Discharge: 2012-02-21 | Disposition: A | Discharge status: Home / Self Care | Payer: Medicaid Other | Attending: Emergency Medicine | Admitting: Emergency Medicine

## 2012-02-21 ENCOUNTER — Encounter (HOSPITAL_COMMUNITY): Payer: Self-pay | Admitting: *Deleted

## 2012-02-21 DIAGNOSIS — M5137 Other intervertebral disc degeneration, lumbosacral region: Secondary | ICD-10-CM | POA: Insufficient documentation

## 2012-02-21 DIAGNOSIS — I10 Essential (primary) hypertension: Secondary | ICD-10-CM | POA: Insufficient documentation

## 2012-02-21 DIAGNOSIS — S39012A Strain of muscle, fascia and tendon of lower back, initial encounter: Secondary | ICD-10-CM

## 2012-02-21 DIAGNOSIS — S335XXA Sprain of ligaments of lumbar spine, initial encounter: Secondary | ICD-10-CM | POA: Insufficient documentation

## 2012-02-21 DIAGNOSIS — M51379 Other intervertebral disc degeneration, lumbosacral region without mention of lumbar back pain or lower extremity pain: Secondary | ICD-10-CM | POA: Insufficient documentation

## 2012-02-21 DIAGNOSIS — X58XXXA Exposure to other specified factors, initial encounter: Secondary | ICD-10-CM | POA: Insufficient documentation

## 2012-02-21 DIAGNOSIS — F17200 Nicotine dependence, unspecified, uncomplicated: Secondary | ICD-10-CM | POA: Insufficient documentation

## 2012-02-21 MED ORDER — METHOCARBAMOL 500 MG PO TABS: 1000.0000 mg | ORAL_TABLET | Freq: Three times a day (TID) | ORAL | Status: AC

## 2012-02-21 MED ORDER — METHOCARBAMOL 500 MG PO TABS
1000.0000 mg | ORAL_TABLET | Freq: Once | ORAL | Status: AC
  Administered 2012-02-21: 1000 mg via ORAL
  Filled 2012-02-21: qty 2

## 2012-02-21 MED ORDER — OXYCODONE-ACETAMINOPHEN 5-325 MG PO TABS: 1.0000 | ORAL_TABLET | Freq: Once | ORAL | Status: AC

## 2012-02-21 MED ORDER — OXYCODONE-ACETAMINOPHEN 5-325 MG PO TABS
1.0000 | ORAL_TABLET | Freq: Once | ORAL | Status: AC
  Administered 2012-02-21: 1 via ORAL
  Filled 2012-02-21: qty 1

## 2012-02-21 MED ORDER — PREDNISONE 10 MG PO TABS: ORAL_TABLET | ORAL | Status: DC

## 2012-02-21 MED ORDER — KETOROLAC TROMETHAMINE 60 MG/2ML IM SOLN
60.0000 mg | Freq: Once | INTRAMUSCULAR | Status: AC
  Administered 2012-02-21: 60 mg via INTRAMUSCULAR
  Filled 2012-02-21: qty 2

## 2012-02-21 NOTE — ED Notes (Signed)
Pt c/o lower back pain since last night. Denies injury 

## 2012-02-21 NOTE — ED Provider Notes (Signed)
History     CSN: [id number]  Arrival date & time 02/21/12  0865   First MD Initiated Contact with Patient 02/21/12 [phone]      Chief Complaint  Patient presents with  . 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  Over the past several weeks she has had increased pain with no known injuries such as fall, heavy lifting or twisting.  She does not currently work and has been fairly sedentary.  Her pain has been worse since last night.  She has been taken ibuprofen without relief, her last dose was yesterday.  Also had a few leftover hydrocodone tablets also last dose was yesterday with mild improvement in symptoms.  She denies weakness or numbness in her legs, but states the pain does radiate into her bilateral buttocks.  She denies urinary symptoms including no increased urinary frequency,dysuria, retention or incontinence.  Patient is a 41 y.o. female presenting with back pain. The history is provided by the patient.  Back Pain  This is a recurrent problem. The problem occurs constantly. The pain is associated with no known injury. The pain is present in the lumbar spine. The quality of the pain is described as stabbing. Radiates to: radiates to bilateral buttocks. The pain is at a severity of 8/10. The pain is moderate. The symptoms are aggravated by bending, twisting and certain positions. The pain is the same all the time. Pertinent negatives include no chest pain, no fever, no numbness, no headaches, no abdominal pain, no bowel incontinence, no perianal numbness, no bladder incontinence, no dysuria, no paresthesias, no paresis, no tingling and no weakness. She has tried NSAIDs and analgesics for the symptoms. The treatment provided mild relief.    Past Medical History  Diagnosis  Date  . Hypertension   . Migraine     Past Surgical History  Procedure Date  . Abdominal hysterectomy     History reviewed. No pertinent family history.  History  Substance Use Topics  . Smoking status: Current Everyday Smoker -- 2.0 packs/day    Types: Cigarettes  . Smokeless tobacco: Not on file  . Alcohol Use: No    OB History    Grav Para Term Preterm Abortions TAB SAB Ect Mult Living                  Review of Systems  Constitutional: Negative for fever.  HENT: Negative for congestion, sore throat and neck pain.   Eyes: Negative.   Respiratory: Negative for chest tightness and shortness of breath.   Cardiovascular: Negative for chest pain.  Gastrointestinal: Negative for nausea, abdominal pain and bowel incontinence.  Genitourinary: Negative.  Negative for bladder incontinence and dysuria.  Musculoskeletal: Positive for back pain. Negative for joint swelling and arthralgias.  Skin: Negative.  Negative for rash and wound.  Neurological: Negative for dizziness, tingling, weakness, light-headedness, numbness, headaches and paresthesias.  Hematological: Negative.   Psychiatric/Behavioral: Negative.     Allergies  Review of patient's allergies indicates no known allergies.  Home Medications   Current Outpatient Rx  Name Route Sig Dispense Refill  . IBUPROFEN 200 MG PO TABS Oral Take 800 mg by mouth every 6 (six) hours as needed. Pain    . METHOCARBAMOL 500 MG PO TABS Oral Take 2 tablets (1,000 mg total) by mouth 3 (three) times daily. 30 tablet 0  .  OXYCODONE-ACETAMINOPHEN 5-325 MG PO TABS Oral Take 1 tablet by mouth once. 20 tablet 0  . PREDNISONE 10 MG PO TABS  6, 5, 4, 3, 2 then 1 tablet by mouth daily for 6 days total. 21 tablet 0    BP 152/106  Pulse 72  Temp(Src) 97.9 F (36.6 C) (Oral)  Resp 16  Ht 5\' 7"  (1.702 m)  Wt 183 lb (83.008 kg)  BMI 28.66 kg/m2  SpO2 99%  LMP 01/29/2012  Physical Exam  Nursing note and vitals reviewed. Constitutional:  She is oriented to person, place, and time. She appears well-developed and well-nourished.  HENT:  Head: Normocephalic.  Eyes: Conjunctivae are normal.  Neck: Normal range of motion. Neck supple.  Cardiovascular: Regular rhythm and intact distal pulses.        Pedal pulses normal.  Pulmonary/Chest: Effort normal. She has no wheezes.  Abdominal: Soft. Bowel sounds are normal. She exhibits no distension and no mass.  Musculoskeletal: Normal range of motion. She exhibits no edema.       Lumbar back: She exhibits tenderness. She exhibits no swelling, no edema and no spasm.       Bilateral pain to deep palpation of sciatic notch bilateral, no radiation.  Neurological: She is alert and oriented to person, place, and time. She has normal strength. She displays no atrophy and no tremor. No cranial nerve deficit or sensory deficit. Gait normal.  Reflex Scores:      Patellar reflexes are 2+ on the right side and 2+ on the left side.      Achilles reflexes are 2+ on the right side and 2+ on the left side.      No strength deficit noted in hip and knee flexor and extensor muscle groups.  Ankle flexion and extension intact.  Skin: Skin is warm and dry.  Psychiatric: She has a normal mood and affect.    ED Course  Procedures (including critical care time)  Labs Reviewed - No data to display No results found.   1. Lumbar strain     Patient was treated with Toradol IM injection, oxycodone tablet and first dose of Robaxin.  MDM  Prescriptions given include oxycodone, Robaxin and a six-day prednisone taper.  Patient instructed to DC her ibuprofen use.  Heating pad, avoid lifting bending twisting etc.  Referral to Dr.Dalton-Bethea for further management his symptoms are not improved.  No neuro deficit on exam or by history to suggest emergent or surgical presentation.          Candis Musa, PA 02/21/12 1022

## 2012-02-21 NOTE — ED Provider Notes (Signed)
Medical screening examination/treatment/procedure(s) were performed by non-physician practitioner and as supervising physician I was immediately available for consultation/collaboration.   Gavin Pound. Sheyenne Konz, MD 02/21/12 1041

## 2012-02-21 NOTE — Discharge Instructions (Signed)
Back Pain, Adult Low back pain is very common. About 1 in 5 people have back pain.The cause of low back pain is rarely dangerous. The pain often gets better over time.About half of people with a sudden onset of back pain feel better in just 2 weeks. About 8 in 10 people feel better by 6 weeks.  CAUSES Some common causes of back pain include:  Strain of the muscles or ligaments supporting the spine.   Wear and tear (degeneration) of the spinal discs.   Arthritis.   Direct injury to the back.  DIAGNOSIS Most of the time, the direct cause of low back pain is not known.However, back pain can be treated effectively even when the exact cause of the pain is unknown.Answering your caregiver's questions about your overall health and symptoms is one of the most accurate ways to make sure the cause of your pain is not dangerous. If your caregiver needs more information, he or she may order lab work or imaging tests (X-rays or MRIs).However, even if imaging tests show changes in your back, this usually does not require surgery. HOME CARE INSTRUCTIONS For many people, back pain returns.Since low back pain is rarely dangerous, it is often a condition that people can learn to [facility] their own.   Remain active. It is stressful on the back to sit or stand in one place. Do not sit, drive, or stand in one place for more than 30 minutes at a time. Take short walks on level surfaces as soon as pain allows.Try to increase the length of time you walk each day.   Do not stay in bed.Resting more than 1 or 2 days can delay your recovery.   Do not avoid exercise or work.Your body is made to move.It is not dangerous to be active, even though your back may hurt.Your back will likely heal faster if you return to being active before your pain is gone.   Pay attention to your body when you bend and lift. Many people have less discomfortwhen lifting if they bend their knees, keep the load close to their  bodies,and avoid twisting. Often, the most comfortable positions are those that put less stress on your recovering back.   Find a comfortable position to sleep. Use a firm mattress and lie on your side with your knees slightly bent. If you lie on your back, put a pillow under your knees.   Only take over-the-counter or prescription medicines as directed by your caregiver. Over-the-counter medicines to reduce pain and inflammation are often the most helpful.Your caregiver may prescribe muscle relaxant drugs.These medicines help dull your pain so you can more quickly return to your normal activities and healthy exercise.   Put ice on the injured area.   Put ice in a plastic bag.   Place a towel between your skin and the bag.   Leave the ice on for 15 to 20 minutes, 3 to 4 times a day for the first 2 to 3 days. After that, ice and heat may be alternated to reduce pain and spasms.   Ask your caregiver about trying back exercises and gentle massage. This may be of some benefit.   Avoid feeling anxious or stressed.Stress increases muscle tension and can worsen back pain.It is important to recognize when you are anxious or stressed and learn ways to manage it.Exercise is a great option.  SEEK MEDICAL CARE IF:  You have pain that is not relieved with rest or medicine.   You have  pain that does not improve in 1 week.   You have new symptoms.   You are generally not feeling well.  SEEK IMMEDIATE MEDICAL CARE IF:   You have pain that radiates from your back into your legs.   You develop new bowel or bladder control problems.   You have unusual weakness or numbness in your arms or legs.   You develop nausea or vomiting.   You develop abdominal pain.   You feel faint.  Document Released: 11/27/2005 Document Revised: 11/16/2011 Document Reviewed: 04/17/2011 [facility] Patient Information 2012 [place], [place].    Use medications as directed.  Do not drive within 4 hours of taking  oxycodone as this will make you drowsy.  Avoid lifting,  Bending,  Twisting or any other activity that worsens your pain over the next week.  Apply an  icepack  to your lower back for 10-15 minutes every 2 hours for the next 2 days.  You should get rechecked if your symptoms are not better over the next 5 days,  Or you develop increased pain,  Weakness in your leg(s) or loss of bladder or bowel function - these are symptoms of a worse injury.

## 2012-03-26 ENCOUNTER — Emergency Department (HOSPITAL_COMMUNITY)
Admission: EM | Admit: 2012-03-26 | Discharge: 2012-03-26 | Disposition: A | Discharge status: Home / Self Care | Payer: Medicaid Other | Attending: Emergency Medicine | Admitting: Emergency Medicine

## 2012-03-26 ENCOUNTER — Encounter (HOSPITAL_COMMUNITY): Payer: Self-pay

## 2012-03-26 DIAGNOSIS — I10 Essential (primary) hypertension: Secondary | ICD-10-CM

## 2012-03-26 DIAGNOSIS — M545 Low back pain, unspecified: Secondary | ICD-10-CM | POA: Insufficient documentation

## 2012-03-26 DIAGNOSIS — R42 Dizziness and giddiness: Secondary | ICD-10-CM | POA: Insufficient documentation

## 2012-03-26 DIAGNOSIS — F17200 Nicotine dependence, unspecified, uncomplicated: Secondary | ICD-10-CM | POA: Insufficient documentation

## 2012-03-26 DIAGNOSIS — G43909 Migraine, unspecified, not intractable, without status migrainosus: Secondary | ICD-10-CM | POA: Insufficient documentation

## 2012-03-26 DIAGNOSIS — Z79899 Other long term (current) drug therapy: Secondary | ICD-10-CM | POA: Insufficient documentation

## 2012-03-26 DIAGNOSIS — M549 Dorsalgia, unspecified: Secondary | ICD-10-CM

## 2012-03-26 MED ORDER — OXYCODONE-ACETAMINOPHEN 5-325 MG PO TABS: 1.0000 | ORAL_TABLET | Freq: Four times a day (QID) | ORAL | Status: AC | PRN

## 2012-03-26 MED ORDER — LISINOPRIL 10 MG PO TABS
20.0000 mg | ORAL_TABLET | Freq: Once | ORAL | Status: AC
  Administered 2012-03-26: 20 mg via ORAL
  Filled 2012-03-26: qty 1

## 2012-03-26 MED ORDER — METHOCARBAMOL 500 MG PO TABS: 500.0000 mg | ORAL_TABLET | Freq: Two times a day (BID) | ORAL | Status: AC

## 2012-03-26 MED ORDER — HYDROMORPHONE HCL PF 2 MG/ML IJ SOLN
2.0000 mg | Freq: Once | INTRAMUSCULAR | Status: AC
  Administered 2012-03-26: 2 mg via INTRAMUSCULAR
  Filled 2012-03-26: qty 1

## 2012-03-26 MED ORDER — PREDNISONE 10 MG PO TABS: ORAL_TABLET | ORAL | Status: DC

## 2012-03-26 MED ORDER — LISINOPRIL 20 MG PO TABS: 20.0000 mg | ORAL_TABLET | Freq: Every day | ORAL | Status: DC

## 2012-03-26 NOTE — ED Notes (Signed)
Pt reports back pain for 1 month. Seen in ED and treated and referred to see PCP for back pain. Pt reports appt is several weeks ago and back pain is worse and can't wait. Pt ambulated to room without difficulty. Pt appears uncomfortable.

## 2012-03-26 NOTE — Discharge Instructions (Signed)
Follow up in 1-2 weeks for recheck °

## 2012-03-26 NOTE — ED Notes (Signed)
Lower back pain with hx of same. Pt states lower back pain with shooting pain radiating down right leg. Unable to get in to see MD for another 2 weeks. Needs pain control until then, per pt.

## 2012-03-26 NOTE — ED Notes (Signed)
Given graham crackers and cola,

## 2012-03-26 NOTE — ED Notes (Signed)
Ambulated to BR, without problem.  Says pain is decreasing.

## 2012-03-26 NOTE — ED Notes (Signed)
Pt refusing gown at this time

## 2012-03-26 NOTE — ED Provider Notes (Signed)
History  This chart was scribed for [name], MD by Cherlynn Perches. The patient was seen in room APFT24/APFT24. Patient's care was started at 1001.      CSN: [id number]  Arrival date & time 03/26/12  1001   First MD Initiated Contact with Patient 03/26/12 1017      Chief Complaint  Patient presents with  . Back Pain    (Consider location/radiation/quality/duration/timing/severity/associated sxs/prior treatment) Patient is a 41 y.o. female presenting with back pain. The history is provided by the patient. No language interpreter was used.  Back Pain  This is a recurrent problem. The current episode started more than 1 week ago. The problem occurs constantly. The problem has not changed since onset.The pain is associated with no known injury. The pain is present in the lumbar spine. The quality of the pain is described as stabbing. The pain radiates to the left thigh and right thigh (radiates to hips and uppper leg). The pain is moderate. The symptoms are aggravated by bending and twisting. Pertinent negatives include no chest pain, no fever, no numbness, no headaches, no abdominal pain, no dysuria, no tingling and no weakness. She has tried NSAIDs and analgesics for the symptoms. The treatment provided moderate relief.    Molly Munoz is a 41 y.o. female who presents to the Emergency Department complaining of one month of constant, sudden onset, unchanging, moderate lower back pain radiating to hips. Pain is made worse with bending and twisting. Pt reports having an MRI done in this ED a few years ago that showed a "messed up L2 and L3." Pt has an appointment with a new PCP in [place] in 2 weeks but wants something to control the pain. Pt was seen in this ED a month ago for the same issue and was given prednisone and pain medication that was effective. Per previous charts, the pain is associated with no known injury. Pt also has high blood pressure and reports not taking  lisinopril for the past few months because she had not seen a doctor for a new prescription. Pt has h/o HTN and migraines. Pt is a current everyday smoker and denies alcohol use.    Past Medical History  Diagnosis Date  . Hypertension   . Migraine     Past Surgical History  Procedure Date  . Abdominal hysterectomy     No family history on file.  History  Substance Use Topics  . Smoking status: Current Everyday Smoker -- 2.0 packs/day    Types: Cigarettes  . Smokeless tobacco: Not on file  . Alcohol Use: No    OB History    Grav Para Term Preterm Abortions TAB SAB Ect Mult Living                  Review of Systems  Constitutional: Negative for fever and fatigue.  HENT: Negative for congestion, sinus pressure and ear discharge.   Eyes: Negative for discharge.  Respiratory: Negative for cough.   Cardiovascular: Negative for chest pain.  Gastrointestinal: Negative for abdominal pain and diarrhea.  Genitourinary: Negative for dysuria, frequency and hematuria.  Musculoskeletal: Positive for back pain.  Skin: Negative for rash.  Neurological: Negative for tingling, seizures, weakness, numbness and headaches.  Hematological: Negative.   Psychiatric/Behavioral: Negative for hallucinations.    Allergies  Review of patient's allergies indicates no known allergies.  Home Medications   Current Outpatient Rx  Name Route Sig Dispense Refill  . IBUPROFEN 200 MG PO TABS Oral  Take 800 mg by mouth every 6 (six) hours as needed. Pain    . PREDNISONE 10 MG PO TABS  6, 5, 4, 3, 2 then 1 tablet by mouth daily for 6 days total. 21 tablet 0    BP 195/123  Pulse 75  Temp(Src) 98.2 F (36.8 C) (Oral)  Resp 18  Ht 5\' 8"  (1.727 m)  SpO2 100%  LMP 01/29/2012  Physical Exam  Nursing note and vitals reviewed. Constitutional: She is oriented to person, place, and time. She appears well-developed and well-nourished.  HENT:  Head: Normocephalic and atraumatic.  Eyes: Conjunctivae  and EOM are normal. No scleral icterus.  Neck: Neck supple. No thyromegaly present.  Cardiovascular: Normal rate and regular rhythm.  Exam reveals no gallop and no friction rub.   No murmur heard. Pulmonary/Chest: No stridor. She has no wheezes. She has no rales. She exhibits no tenderness.  Abdominal: She exhibits no distension. There is no tenderness. There is no rebound.  Musculoskeletal: Normal range of motion. She exhibits tenderness (tender lumbar spine). She exhibits no edema.  Lymphadenopathy:    She has no cervical adenopathy.  Neurological: She is alert and oriented to person, place, and time. Coordination normal.  Skin: Skin is warm and dry. No rash noted. No erythema.  Psychiatric: She has a normal mood and affect. Her behavior is normal.    ED Course  Procedures (including critical care time)  DIAGNOSTIC STUDIES: Oxygen Saturation is 100% on room air, normal by my interpretation.    COORDINATION OF CARE: 10:30AM-Patient informed of current plan for treatment and evaluation and agrees with plan at this time.  11:15AM - Patient feeling dizzy and weak after getting Dilaudid injection. Will recheck soon. 11:53AM- Patient reports she is feeling better at this time as back pain and dizziness have improved. Will d/c home. Patient agrees with plan.   Labs Reviewed - No data to display No results found.   No diagnosis found.    MDM        The chart was scribed for me under my direct supervision.  I personally performed the history, physical, and medical decision making and all procedures in the evaluation of this patient.[name], MD 03/26/12 [phone]

## 2012-04-01 ENCOUNTER — Encounter (HOSPITAL_COMMUNITY): Payer: Self-pay

## 2012-04-01 ENCOUNTER — Emergency Department (HOSPITAL_COMMUNITY)
Admission: EM | Admit: 2012-04-01 | Discharge: 2012-04-01 | Disposition: A | Discharge status: Home / Self Care | Payer: Medicaid Other | Attending: Emergency Medicine | Admitting: Emergency Medicine

## 2012-04-01 DIAGNOSIS — F17200 Nicotine dependence, unspecified, uncomplicated: Secondary | ICD-10-CM | POA: Insufficient documentation

## 2012-04-01 DIAGNOSIS — G8929 Other chronic pain: Secondary | ICD-10-CM | POA: Insufficient documentation

## 2012-04-01 DIAGNOSIS — I10 Essential (primary) hypertension: Secondary | ICD-10-CM | POA: Insufficient documentation

## 2012-04-01 DIAGNOSIS — M545 Low back pain, unspecified: Secondary | ICD-10-CM | POA: Insufficient documentation

## 2012-04-01 MED ORDER — OXYCODONE-ACETAMINOPHEN 5-325 MG PO TABS
2.0000 | ORAL_TABLET | Freq: Once | ORAL | Status: AC
  Administered 2012-04-01: 2 via ORAL
  Filled 2012-04-01: qty 2

## 2012-04-01 MED ORDER — OXYCODONE-ACETAMINOPHEN 5-325 MG PO TABS
2.0000 | ORAL_TABLET | ORAL | Status: AC | PRN
Start: 2012-04-01 — End: 2012-04-11

## 2012-04-01 MED ORDER — CYCLOBENZAPRINE HCL 10 MG PO TABS
10.0000 mg | ORAL_TABLET | Freq: Two times a day (BID) | ORAL | Status: AC | PRN
Start: 2012-04-01 — End: 2012-04-11

## 2012-04-01 NOTE — Discharge Instructions (Signed)
Chronic Back Pain When back pain lasts longer than 3 months, it is called chronic back pain.This pain can be frustrating, but the cause of the pain is rarely dangerous.People with chronic back pain often go through certain periods that are more intense (flare-ups). CAUSES Chronic back pain can be caused by wear and tear (degeneration) on different structures in your back. These structures may include bones, ligaments, or discs. This degeneration may result in more pressure being placed on the nerves that travel to your legs and feet. This can lead to pain traveling from the low back down the back of the legs. When pain lasts longer than 3 months, it is not unusual for people to experience anxiety or depression. Anxiety and depression can also contribute to low back pain. TREATMENT  Establish a regular exercise plan. This is critical to improving your functional level.   Have a self-management plan for when you flare-up. Flare-ups rarely require a medical visit. Regular exercise will help reduce the intensity and frequency of your flare-ups.   Manage how you feel about your back pain and the rest of your life. Anxiety, depression, and feeling that you cannot alter your back pain have been shown to make back pain more intense and debilitating.   Medicines should never be your only treatment. They should be used along with other treatments to help you return to a more active lifestyle.   Procedures such as injections or surgery may be helpful but are rarely necessary. You may be able to get the same results with physical therapy or chiropractic care.  HOME CARE INSTRUCTIONS  Avoid bending, heavy lifting, prolonged sitting, and activities which make the problem worse.   Continue normal activity as much as possible.   Take brief periods of rest throughout the day to reduce your pain during flare-ups.   Follow your back exercise rehabilitation program. This can help reduce symptoms and prevent  more pain.   Only take over-the-counter or prescription medicines as directed by your caregiver. Muscle relaxants are sometimes prescribed. Narcotic pain medicine is discouraged for long-term pain, since addiction is a possible outcome.   If you smoke, quit.   Eat healthy foods and maintain a recommended body weight.  SEEK IMMEDIATE MEDICAL CARE IF:   You have weakness or numbness in one of your legs or feet.   You have trouble controlling your bladder or bowels.   You develop nausea, vomiting, abdominal pain, shortness of breath, or fainting.  Document Released: 01/04/2005 Document Revised: 11/16/2011 Document Reviewed: 11/11/2011 [employer] Patient Information 2012 [employer]. 

## 2012-04-01 NOTE — ED Notes (Signed)
Pt complain of low back pain. Has disc problem. States she needs pain meds until she can see her doctor next week. Was here a week ago for same

## 2012-04-01 NOTE — ED Provider Notes (Signed)
Medical screening examination/treatment/procedure(s) were performed by non-physician practitioner and as supervising physician I was immediately available for consultation/collaboration.  Nicoletta Dress. Colon Branch, MD 04/01/12 2221

## 2012-04-01 NOTE — ED Provider Notes (Signed)
History     CSN: [id number]  Arrival date & time 04/01/12  0955   None     Chief Complaint  Patient presents with  . Back Pain    HPI Molly Munoz is a 41 y.o. female who presents to the ED for back pain. The pain is in the lower back. The patient was here last week for same. She states that she needs more pain medication until she can see her doctor in [place] next week. She describes the pain as severe. History of chronic back pain. Had an MRI here a coup years ago. Patient states she can not sit and can not sleep due to pain. Denies loss of control of bladder or bowels. Still taking prednisone from previous visit. Out of percocet. This history was provided by the patient.  Past Medical History  Diagnosis Date  . Hypertension   . Migraine     Past Surgical History  Procedure Date  . Abdominal hysterectomy     No family history on file.  History  Substance Use Topics  . Smoking status: Current Everyday Smoker -- 2.0 packs/day    Types: Cigarettes  . Smokeless tobacco: Not on file  . Alcohol Use: No    OB History    Grav Para Term Preterm Abortions TAB SAB Ect Mult Living                  Review of Systems  Constitutional: Negative for fever, chills, diaphoresis and fatigue.  HENT: Negative for ear pain, congestion, sore throat, facial swelling, neck stiffness, dental problem and sinus pressure.   Eyes: Negative for photophobia, pain and discharge.  Respiratory: Negative for cough, chest tightness and wheezing.   Cardiovascular: Negative for chest pain, palpitations and leg swelling.  Gastrointestinal: Negative for nausea, vomiting, abdominal pain, diarrhea, constipation and abdominal distention.  Genitourinary: Negative for dysuria, frequency, flank pain, vaginal bleeding, vaginal discharge, difficulty urinating and pelvic pain.  Musculoskeletal: Positive for back pain. Negative for myalgias and gait problem.  Skin: Negative for color change and rash.    Neurological: Positive for headaches. Negative for dizziness, speech difficulty, weakness, light-headedness and numbness.  Psychiatric/Behavioral: Negative for confusion and agitation. The patient is not nervous/anxious.     Allergies  Review of patient's allergies indicates no known allergies.  Home Medications   Current Outpatient Rx  Name Route Sig Dispense Refill  . IBUPROFEN 200 MG PO TABS Oral Take 800 mg by mouth every 6 (six) hours as needed. Pain    . LISINOPRIL 20 MG PO TABS Oral Take 1 tablet (20 mg total) by mouth daily. 30 tablet 1  . METHOCARBAMOL 500 MG PO TABS Oral Take 1 tablet (500 mg total) by mouth 2 (two) times daily. 30 tablet 0  . OXYCODONE-ACETAMINOPHEN 5-325 MG PO TABS Oral Take 1 tablet by mouth every 6 (six) hours as needed for pain. 20 tablet 0  . PREDNISONE 10 MG PO TABS  6, 5, 4, 3, 2 then 1 tablet by mouth daily for 6 days total. 21 tablet 0  . PREDNISONE 10 MG PO TABS  Take one pill a day 7 tablet 0    BP 170/109  Pulse 73  Temp(Src) 98.5 F (36.9 C) (Oral)  Resp 18  Ht 5\' 7"  (1.702 m)  Wt 170 lb (77.111 kg)  BMI 26.63 kg/m2  SpO2 100%  LMP 01/29/2012  Physical Exam  Nursing note and vitals reviewed. Constitutional: She is oriented to person, place, and  time. She appears well-developed and well-nourished. No distress.       Appears very uncomfortable.  HENT:  Head: Normocephalic and atraumatic.  Eyes: EOM are normal.  Neck: Neck supple.  Cardiovascular: Normal rate.   Pulmonary/Chest: Effort normal.  Abdominal: Soft. There is no tenderness.  Musculoskeletal:       Pain right lower lumbar area with palpation and range of motion. Pain with straight leg raises right greater than left. Pedal pulses present and equal bilateral. Adequate circulation. Difficulty with walking due to pain but does not drag either foot. Can stand on each foot independent of the other without difficulty.  Neurological: She is alert and oriented to person, place, and  time. No cranial nerve deficit.       Reflexes equal bilateral. Good strength and equal upper and lower extremities.   Skin: Skin is warm and dry.  Psychiatric: Her behavior is normal. Judgment and thought content normal. Her mood appears anxious.   Assessment: Chronic back pain   Plan:  Discussed with patient in detail need for follow up with her PCP as scheduled and plan for medication refills in an effort to not use the ED for chronic problems.  Rx Percocet Rx Flexeril Follow up with PCP Patient voices understanding I have reviewed this patient's vital signs, nurses notes. ED Course  Procedures    MDM          Janne Napoleon, NP 04/01/12 1135

## 2012-04-09 ENCOUNTER — Encounter (HOSPITAL_COMMUNITY): Payer: Self-pay | Admitting: Emergency Medicine

## 2012-04-09 ENCOUNTER — Emergency Department (HOSPITAL_COMMUNITY)
Admission: EM | Admit: 2012-04-09 | Discharge: 2012-04-09 | Disposition: A | Discharge status: Home / Self Care | Payer: Medicaid Other | Attending: Emergency Medicine | Admitting: Emergency Medicine

## 2012-04-09 DIAGNOSIS — IMO0002 Reserved for concepts with insufficient information to code with codable children: Secondary | ICD-10-CM | POA: Insufficient documentation

## 2012-04-09 DIAGNOSIS — M545 Low back pain, unspecified: Secondary | ICD-10-CM | POA: Insufficient documentation

## 2012-04-09 DIAGNOSIS — M5416 Radiculopathy, lumbar region: Secondary | ICD-10-CM

## 2012-04-09 DIAGNOSIS — I10 Essential (primary) hypertension: Secondary | ICD-10-CM | POA: Insufficient documentation

## 2012-04-09 HISTORY — DX: Other chronic pain: G89.29

## 2012-04-09 HISTORY — DX: Dorsalgia, unspecified: M54.9

## 2012-04-09 MED ORDER — CYCLOBENZAPRINE HCL 5 MG PO TABS: 5.0000 mg | ORAL_TABLET | Freq: Three times a day (TID) | ORAL | Status: AC | PRN

## 2012-04-09 MED ORDER — OXYCODONE-ACETAMINOPHEN 5-325 MG PO TABS
2.0000 | ORAL_TABLET | Freq: Once | ORAL | Status: AC
  Administered 2012-04-09: 2 via ORAL
  Filled 2012-04-09: qty 2

## 2012-04-09 MED ORDER — CYCLOBENZAPRINE HCL 10 MG PO TABS
5.0000 mg | ORAL_TABLET | Freq: Once | ORAL | Status: AC
  Administered 2012-04-09: 5 mg via ORAL
  Filled 2012-04-09: qty 1

## 2012-04-09 MED ORDER — OXYCODONE-ACETAMINOPHEN 5-325 MG PO TABS: 1.0000 | ORAL_TABLET | ORAL | Status: AC | PRN

## 2012-04-09 NOTE — ED Notes (Signed)
Has not taken bp meds this am  
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
## 2012-04-09 NOTE — ED Notes (Signed)
C/o chronic lower back pain radiating down both legs. Pt slightly figity due to pain. Has appt this Friday. States needs something for pain unitl then. Recent ED visit also.

## 2012-04-09 NOTE — Discharge Instructions (Signed)
Lumbosacral Radiculopathy Lumbosacral radiculopathy is a pinched nerve or nerves in the low back (lumbosacral area). When this happens you may have weakness in your legs and may not be able to stand on your toes. You may have pain going down into your legs. There may be difficulties with walking normally. There are many causes of this problem. Sometimes this may happen from an injury, or simply from arthritis or boney problems. It may also be caused by other illnesses such as diabetes. If there is no improvement after treatment, further studies may be done to find the exact cause. DIAGNOSIS  X-rays may be needed if the problems become long standing. Electromyograms may be done. This study is one in which the working of nerves and muscles is studied. HOME CARE INSTRUCTIONS   Applications of ice packs may be helpful. Ice can be used in a plastic bag with a towel around it to prevent frostbite to skin. This may be used every 2 hours for 20 to 30 minutes, or as needed, while awake, or as directed by your caregiver.   Only take over-the-counter or prescription medicines for pain, discomfort, or fever as directed by your caregiver.   If physical therapy was prescribed, follow your caregiver's directions.  SEEK IMMEDIATE MEDICAL CARE IF:   You have pain not controlled with medications.   You seem to be getting worse rather than better.   You develop increasing weakness in your legs.   You develop loss of bowel or bladder control.   You have difficulty with walking or balance, or develop clumsiness in the use of your legs.   You have a fever.  MAKE SURE YOU:   Understand these instructions.   Will watch your condition.   Will get help right away if you are not doing well or get worse.  Document Released: 11/27/2005 Document Revised: 11/16/2011 Document Reviewed: 07/17/2008 [facility] Patient Information 2012 [place], [place].   Do not drive within 4 hours of taking oxycodone and this  medication will make you drowsy.  Plan to see your new physician at [facility] on Friday as scheduled.

## 2012-04-15 NOTE — ED Provider Notes (Signed)
Medical screening examination/treatment/procedure(s) were performed by non-physician practitioner and as supervising physician I was immediately available for consultation/collaboration.   Aara Jacquot, MD 04/15/12 0658 

## 2012-04-24 ENCOUNTER — Encounter (HOSPITAL_COMMUNITY): Payer: Self-pay | Admitting: *Deleted

## 2012-04-24 ENCOUNTER — Emergency Department (HOSPITAL_COMMUNITY)
Admission: EM | Admit: 2012-04-24 | Discharge: 2012-04-24 | Disposition: A | Discharge status: Home / Self Care | Payer: Medicaid Other | Attending: Emergency Medicine | Admitting: Emergency Medicine

## 2012-04-24 DIAGNOSIS — J019 Acute sinusitis, unspecified: Secondary | ICD-10-CM | POA: Insufficient documentation

## 2012-04-24 DIAGNOSIS — G8929 Other chronic pain: Secondary | ICD-10-CM | POA: Insufficient documentation

## 2012-04-24 DIAGNOSIS — I10 Essential (primary) hypertension: Secondary | ICD-10-CM | POA: Insufficient documentation

## 2012-04-24 MED ORDER — BENZONATATE 100 MG PO CAPS
200.0000 mg | ORAL_CAPSULE | Freq: Once | ORAL | Status: AC
  Administered 2012-04-24: 200 mg via ORAL
  Filled 2012-04-24: qty 2

## 2012-04-24 MED ORDER — HYDROCOD POLST-CHLORPHEN POLST 10-8 MG/5ML PO LQCR: 5.0000 mL | Freq: Two times a day (BID) | ORAL | Status: DC

## 2012-04-24 MED ORDER — LISINOPRIL 10 MG PO TABS
20.0000 mg | ORAL_TABLET | Freq: Once | ORAL | Status: AC
  Administered 2012-04-24: 20 mg via ORAL
  Filled 2012-04-24 (×2): qty 1

## 2012-04-24 MED ORDER — AMOXICILLIN-POT CLAVULANATE 875-125 MG PO TABS: 1.0000 | ORAL_TABLET | Freq: Two times a day (BID) | ORAL | Status: AC

## 2012-04-24 MED ORDER — AMOXICILLIN-POT CLAVULANATE 875-125 MG PO TABS
1.0000 | ORAL_TABLET | Freq: Once | ORAL | Status: AC
  Administered 2012-04-24: 1 via ORAL
  Filled 2012-04-24: qty 1

## 2012-04-24 NOTE — ED Notes (Signed)
Patient with no complaints at this time. Respirations even and unlabored. Skin warm/dry. Discharge instructions reviewed with patient at this time. Patient given opportunity to voice concerns/ask questions. Patient discharged at this time and left Emergency Department with steady gait.   

## 2012-04-24 NOTE — Discharge Instructions (Signed)
Sinusitis Sinuses are air pockets within the bones of your face. The growth of bacteria within a sinus leads to infection. The infection prevents the sinuses from draining. This infection is called sinusitis. SYMPTOMS  There will be different areas of pain depending on which sinuses have become infected.  The maxillary sinuses often produce pain beneath the eyes.   Frontal sinusitis may cause pain in the middle of the forehead and above the eyes.  Other problems (symptoms) include:  Toothaches.   Colored, pus-like (purulent) drainage from the nose.   Swelling, warmth, and tenderness over the sinus areas may be signs of infection.  TREATMENT  Sinusitis is most often determined by an exam.X-rays may be taken. If x-rays have been taken, make sure you obtain your results or find out how you are to obtain them. Your caregiver may give you medications (antibiotics). These are medications that will help kill the bacteria causing the infection. You may also be given a medication (decongestant) that helps to reduce sinus swelling.  HOME CARE INSTRUCTIONS   Only take over-the-counter or prescription medicines for pain, discomfort, or fever as directed by your caregiver.   Drink extra fluids. Fluids help thin the mucus so your sinuses can drain more easily.   Applying either moist heat or ice packs to the sinus areas may help relieve discomfort.   Use saline nasal sprays to help moisten your sinuses. The sprays can be found at your local drugstore.  SEEK IMMEDIATE MEDICAL CARE IF:  You have a fever.   You have increasing pain, severe headaches, or toothache.   You have nausea, vomiting, or drowsiness.   You develop unusual swelling around the face or trouble seeing.  MAKE SURE YOU:   Understand these instructions.   Will watch your condition.   Will get help right away if you are not doing well or get worse.  Document Released: 11/27/2005 Document Revised: 11/16/2011 Document Reviewed:  06/26/2007 [facility] Patient Information 2012 [place], [place].   Use the antibiotic until completed, making sure you take the entire course of this medication.  You may use Tussionex which will help with facial pain and your cough, this is a narcotic and will make you drowsy do not drive within 4 hours of taking.  You may purchase Coricidin brand decongestant which will help with your facial pressure and congestion that will not cause your blood pressure to elevate further.  Make sure you are taking your home medications including your lisinopril as your blood pressure was elevated this morning.  Call your doctor for recheck if not improving over the next several days.

## 2012-04-24 NOTE — ED Provider Notes (Signed)
History     CSN: [id number]  Arrival date & time 04/24/12  4098   First MD Initiated Contact with Patient 04/24/12 [phone]      Chief Complaint  Patient presents with  . Cough    (Consider location/radiation/quality/duration/timing/severity/associated sxs/prior treatment) HPI Comments: Patient presents for evaluation of cough congestion facial pain and left earache which is been present for the past week.  She states she developed a URI 2 weeks ago involving cough and clear nasal congestion.  The congestion has since become green with changes.  She also describes left maxillary and frontal pain in association with subjective fever, postnasal drip and cough.  She denies shortness of breath and chest pain.  She took 2 days of leftover Amoxil last dose yesterday, and has also used Claritin with no relief of her symptoms.  Patient is a 41 y.o. female presenting with cough. The history is provided by the patient.  Cough The problem occurs constantly. The cough is productive of sputum. Associated symptoms include chills, ear pain, rhinorrhea, sore throat and myalgias. Pertinent negatives include no chest pain, no headaches, no shortness of breath and no wheezing. The treatment provided no relief. She is a smoker.    Past Medical History  Diagnosis Date  . Hypertension   . Migraine   . Chronic back pain     Past Surgical History  Procedure Date  . Abdominal hysterectomy     History reviewed. No pertinent family history.  History  Substance Use Topics  . Smoking status: Current Everyday Smoker -- 2.0 packs/day    Types: Cigarettes  . Smokeless tobacco: Not on file  . Alcohol Use: No    OB History    Grav Para Term Preterm Abortions TAB SAB Ect Mult Living                  Review of Systems  Constitutional: Positive for chills. Negative for fever.  HENT: Positive for ear pain, sore throat, rhinorrhea and sinus pressure. Negative for congestion, trouble swallowing and neck  pain.   Eyes: Negative.   Respiratory: Positive for cough. Negative for chest tightness, shortness of breath and wheezing.   Cardiovascular: Negative for chest pain.  Gastrointestinal: Negative for nausea and abdominal pain.  Genitourinary: Negative.   Musculoskeletal: Positive for myalgias. Negative for joint swelling and arthralgias.  Skin: Negative.  Negative for rash and wound.  Neurological: Negative for dizziness, weakness, light-headedness, numbness and headaches.  Hematological: Negative.   Psychiatric/Behavioral: Negative.     Allergies  Review of patient's allergies indicates no known allergies.  Home Medications   Current Outpatient Rx  Name Route Sig Dispense Refill  . AMOXICILLIN-POT CLAVULANATE 875-125 MG PO TABS Oral Take 1 tablet by mouth every 12 (twelve) hours. 20 tablet 0  . HYDROCOD POLST-CPM POLST ER 10-8 MG/5ML PO LQCR Oral Take 5 mLs by mouth every 12 (twelve) hours. 75 mL 0  . IBUPROFEN 200 MG PO TABS Oral Take 800 mg by mouth every 6 (six) hours as needed. Pain    . LISINOPRIL 20 MG PO TABS Oral Take 1 tablet (20 mg total) by mouth daily. 30 tablet 1  . PREDNISONE 10 MG PO TABS  6, 5, 4, 3, 2 then 1 tablet by mouth daily for 6 days total. 21 tablet 0  . PREDNISONE 10 MG PO TABS  Take one pill a day 7 tablet 0    BP 174/113  Pulse 74  Temp(Src) 98.1 F (36.7 C) (Oral)  Resp  20  Ht 5\' 7"  (1.702 m)  Wt 163 lb (73.936 kg)  BMI 25.53 kg/m2  SpO2 98%  LMP 01/29/2012  Physical Exam  Nursing note and vitals reviewed. Constitutional: She appears well-developed and well-nourished.  HENT:  Head: Normocephalic and atraumatic.  Right Ear: Tympanic membrane and ear canal normal.  Left Ear: Tympanic membrane and ear canal normal.  Nose: Mucosal edema and rhinorrhea present. Left sinus exhibits maxillary sinus tenderness and frontal sinus tenderness.  Mouth/Throat: Uvula is midline and oropharynx is clear and moist. No oropharyngeal exudate, posterior  oropharyngeal edema or posterior oropharyngeal erythema.  Eyes: Conjunctivae are normal.  Neck: Normal range of motion. No thyromegaly present.  Cardiovascular: Normal rate, regular rhythm, normal heart sounds and intact distal pulses.   Pulmonary/Chest: Effort normal and breath sounds normal. She has no wheezes.  Abdominal: Soft. Bowel sounds are normal. There is no tenderness.  Musculoskeletal: Normal range of motion.  Lymphadenopathy:    She has no cervical adenopathy.  Neurological: She is alert.  Skin: Skin is warm and dry.  Psychiatric: She has a normal mood and affect.    ED Course  Procedures (including critical care time)  Labs Reviewed - No data to display No results found.   1. Acute sinusitis   2. Hypertension       MDM  Acute sinusitis.  Patient was prescribed Augmentin and was encouraged to use Coricidin nasal decongestant OTC as this is less likely to elevate her blood pressure.  Her elevated blood pressure was also discussed and she was given her morning dose of lisinopril which she has not taken yet today.  She is also just prescribed Tussionex to assist with cough and facial pain.        Burgess Amor, [place] 04/24/12 [phone]

## 2012-04-24 NOTE — ED Notes (Signed)
Julie Idol, PA at bedside 

## 2012-04-24 NOTE — ED Provider Notes (Signed)
Medical screening examination/treatment/procedure(s) were performed by non-physician practitioner and as supervising physician I was immediately available for consultation/collaboration.   Shahid Flori L Deniss Wormley, MD 04/24/12 1457 

## 2012-04-24 NOTE — ED Notes (Signed)
Awaiting Lisinopril from pharmacy  

## 2012-04-24 NOTE — ED Notes (Signed)
Pt c/o cough, congestion, headache and earache x 2 weeks. Pt states that she is coughing up green phlegm. Denies fever.

## 2012-05-18 ENCOUNTER — Emergency Department (HOSPITAL_COMMUNITY)
Admission: EM | Admit: 2012-05-18 | Discharge: 2012-05-18 | Disposition: A | Discharge status: Home / Self Care | Payer: Medicaid Other | Attending: Emergency Medicine | Admitting: Emergency Medicine

## 2012-05-18 ENCOUNTER — Encounter (HOSPITAL_COMMUNITY): Payer: Self-pay | Admitting: *Deleted

## 2012-05-18 DIAGNOSIS — F17200 Nicotine dependence, unspecified, uncomplicated: Secondary | ICD-10-CM | POA: Insufficient documentation

## 2012-05-18 DIAGNOSIS — X58XXXA Exposure to other specified factors, initial encounter: Secondary | ICD-10-CM | POA: Insufficient documentation

## 2012-05-18 DIAGNOSIS — I10 Essential (primary) hypertension: Secondary | ICD-10-CM | POA: Insufficient documentation

## 2012-05-18 DIAGNOSIS — Z760 Encounter for issue of repeat prescription: Secondary | ICD-10-CM

## 2012-05-18 DIAGNOSIS — S025XXA Fracture of tooth (traumatic), initial encounter for closed fracture: Secondary | ICD-10-CM | POA: Insufficient documentation

## 2012-05-18 DIAGNOSIS — G8929 Other chronic pain: Secondary | ICD-10-CM | POA: Insufficient documentation

## 2012-05-18 MED ORDER — LISINOPRIL 20 MG PO TABS: 20.0000 mg | ORAL_TABLET | Freq: Every day | ORAL | Status: DC

## 2012-05-18 MED ORDER — LISINOPRIL 20 MG PO TABS
20.0000 mg | ORAL_TABLET | Freq: Once | ORAL | Status: AC
  Administered 2012-05-18: 20 mg via ORAL
  Filled 2012-05-18: qty 1

## 2012-05-18 MED ORDER — OXYCODONE-ACETAMINOPHEN 5-325 MG PO TABS
1.0000 | ORAL_TABLET | Freq: Once | ORAL | Status: AC
  Administered 2012-05-18: 1 via ORAL
  Filled 2012-05-18: qty 1

## 2012-05-18 MED ORDER — LISINOPRIL 10 MG PO TABS
ORAL_TABLET | ORAL | Status: AC
  Filled 2012-05-18: qty 2

## 2012-05-18 MED ORDER — OXYCODONE-ACETAMINOPHEN 5-325 MG PO TABS: 1.0000 | ORAL_TABLET | ORAL | Status: AC | PRN

## 2012-05-18 NOTE — ED Provider Notes (Signed)
History     CSN: [id number]  Arrival date & time 05/18/12  0012   First MD Initiated Contact with Patient 05/18/12 0022      Chief Complaint  Patient presents with  . Dental Pain    (Consider location/radiation/quality/duration/timing/severity/associated sxs/prior treatment) HPI Comments: Molly Munoz presents for treatment of a fractured tooth which is been present for at least 2 weeks but has worsened since yesterday.  She said has seen her dentist who refused pulled with tooth at the time of her visit secondary to elevated blood pressure.  Patient states she ran out of her lisinopril more than 2 weeks ago been unable to get back up with her primary Dr. for refill.  She denies gingival swelling, fevers or chills.  The tooth broke when she was chewing a hard piece of food several weeks ago.  Patient also denies headache, dizziness, shortness of breath or chest pain.  Patient is a 41 y.o. female presenting with tooth pain. The history is provided by the patient.  Dental PainPrimary symptoms do not include headaches, fever, shortness of breath or sore throat.  Additional symptoms do not include: facial swelling.    Past Medical History  Diagnosis Date  . Hypertension   . Migraine   . Chronic back pain     Past Surgical History  Procedure Date  . Abdominal hysterectomy     No family history on file.  History  Substance Use Topics  . Smoking status: Current Everyday Smoker -- 2.0 packs/day    Types: Cigarettes  . Smokeless tobacco: Not on file  . Alcohol Use: No    OB History    Grav Para Term Preterm Abortions TAB SAB Ect Mult Living                  Review of Systems  Constitutional: Negative for fever.  HENT: Positive for dental problem. Negative for sore throat, facial swelling, neck pain and neck stiffness.   Respiratory: Negative for shortness of breath.   Cardiovascular: Negative for chest pain and leg swelling.  Neurological: Negative for weakness,  numbness and headaches.  Psychiatric/Behavioral: The patient is nervous/anxious.     Allergies  Review of patient's allergies indicates no known allergies.  Home Medications   Current Outpatient Rx  Name Route Sig Dispense Refill  . HYDROCOD POLST-CPM POLST ER 10-8 MG/5ML PO LQCR Oral Take 5 mLs by mouth every 12 (twelve) hours. 75 mL 0  . LISINOPRIL 20 MG PO TABS Oral Take 1 tablet (20 mg total) by mouth daily. 30 tablet 1  . IBUPROFEN 200 MG PO TABS Oral Take 800 mg by mouth every 6 (six) hours as needed. Pain    . LISINOPRIL 20 MG PO TABS Oral Take 1 tablet (20 mg total) by mouth daily. 30 tablet 0  . OXYCODONE-ACETAMINOPHEN 5-325 MG PO TABS Oral Take 1 tablet by mouth every 4 (four) hours as needed for pain. 20 tablet 0  . PREDNISONE 10 MG PO TABS  6, 5, 4, 3, 2 then 1 tablet by mouth daily for 6 days total. 21 tablet 0  . PREDNISONE 10 MG PO TABS  Take one pill a day 7 tablet 0    BP 178/122  Pulse 94  Temp(Src) 98.1 F (36.7 C) (Oral)  Resp 16  Ht 5\' 7"  (1.702 m)  Wt 167 lb (75.751 kg)  BMI 26.16 kg/m2  SpO2 94%  LMP 01/29/2012  Physical Exam  Constitutional: She is oriented to person, place,  and time. She appears well-developed and well-nourished. No distress.  HENT:  Head: Normocephalic and atraumatic.  Right Ear: Tympanic membrane and external ear normal.  Left Ear: Tympanic membrane and external ear normal.  Mouth/Throat: Oropharynx is clear and moist and mucous membranes are normal. No oral lesions. Dental abscesses present.    Eyes: Conjunctivae are normal.  Neck: Normal range of motion. Neck supple.  Cardiovascular: Normal rate, regular rhythm and normal heart sounds.   Pulmonary/Chest: Effort normal. No respiratory distress. She has no rales.  Abdominal: She exhibits no distension.  Musculoskeletal: Normal range of motion.  Lymphadenopathy:    She has no cervical adenopathy.  Neurological: She is alert and oriented to person, place, and time.  Skin: Skin  is warm and dry. No erythema.  Psychiatric: She has a normal mood and affect.    ED Course  Procedures (including critical care time)  Labs Reviewed - No data to display No results found.   1. Broken tooth   2. Hypertension   3. Medication refill       MDM  Lisinopril prescribed,  Percocet prescribed for pain.  Pt advised to call her dentist this week for tx of this dental pain,  And her pcp for further management of her htn.          Burgess Amor, [place] 05/18/12 [phone]

## 2012-05-18 NOTE — ED Notes (Signed)
Pt reports a broken tooth top left. Has seen dentist but will not pull due to blood pressure.

## 2012-05-18 NOTE — ED Notes (Signed)
Pt alert & oriented x4, stable gait. Pt given discharge instructions, paperwork & prescription(s). Patient instructed to stop at the registration desk to finish any additional paperwork. pt verbalized understanding. Pt left department w/ no further questions.  

## 2012-05-18 NOTE — Discharge Instructions (Signed)
Dental Fracture You have a dental fracture or injury. This can mean the tooth is loose, has a chip in the enamel or is broken. If just the outer enamel is chipped, there is a good chance the tooth will not become infected. The only treatment needed may be to smooth off a rough edge. Fractures into the deeper layers (dentin and pulp) cause greater pain and are more likely to become infected. These require you to see a dentist as soon as possible to save the tooth. Loose teeth may need to be wired or bonded with a plastic splint to hold them in place. A paste may be painted on the open area of the broken tooth to reduce the pain. Antibiotics and pain medicine may be prescribed. Choosing a soft or liquid diet and rinsing the mouth out with warm water after meals may be helpful. See your dentist as recommended. Failure to seek care or follow up with a dentist or other specialist as recommended could result in the loss of your tooth, infection, or permanent dental problems. SEEK MEDICAL CARE IF:   You have increased pain not controlled with medicines.   You have swelling around the tooth, in the face or neck.   You have bleeding which starts, continues, or gets worse.   You have a fever.  Document Released: 01/04/2005 Document Revised: 11/16/2011 Document Reviewed: 10/19/2009 [facility] Patient Information 2012 [place], [place].Hypertension Information As your heart beats, it forces blood through your arteries. This force is your blood pressure. If the pressure is too high, it is called hypertension (HTN) or high blood pressure. HTN is dangerous because you may have it and not know it. High blood pressure may mean that your heart has to work harder to pump blood. Your arteries may be narrow or stiff. The extra work puts you at risk for heart disease, stroke, and other problems.  Blood pressure consists of two numbers, a higher number over a lower, 110/72, for example. It is stated as "110 over 72." The ideal  is below 120 for the top number (systolic) and under 80 for the bottom (diastolic).  You should pay close attention to your blood pressure if you have certain conditions such as:  Heart failure.   Prior heart attack.   Diabetes   Chronic kidney disease.   Prior stroke.   Multiple risk factors for heart disease.  To see if you have HTN, your blood pressure should be measured while you are seated with your arm held at the level of the heart. It should be measured at least twice. A one-time elevated blood pressure reading (especially in the Emergency Department) does not mean that you need treatment. There may be conditions in which the blood pressure is different between your right and left arms. It is important to see your caregiver soon for a recheck. Most people have essential hypertension which means that there is not a specific cause. This type of high blood pressure may be lowered by changing lifestyle factors such as:  Stress.   Smoking.   Lack of exercise.   Excessive weight.   Drug/tobacco/alcohol use.   Eating less salt.  Most people do not have symptoms from high blood pressure until it has caused damage to the body. Effective treatment can often prevent, delay or reduce that damage. TREATMENT  Treatment for high blood pressure, when a cause has been identified, is directed at the cause. There are a large number of medications to treat HTN. These fall into  several categories, and your caregiver will help you select the medicines that are best for you. Medications may have side effects. You should review side effects with your caregiver. If your blood pressure stays high after you have made lifestyle changes or started on medicines,   Your medication(s) may need to be changed.   Other problems may need to be addressed.   Be certain you understand your prescriptions, and know how and when to take your medicine.   Be sure to follow up with your caregiver within the time  frame advised (usually within two weeks) to have your blood pressure rechecked and to review your medications.   If you are taking more than one medicine to lower your blood pressure, make sure you know how and at what times they should be taken. Taking two medicines at the same time can result in blood pressure that is too low.  Document Released: 01/30/2006 Document Revised: 08/09/2011 Document Reviewed: 02/06/2008 [facility] Patient Information 2012 [place], [place].

## 2012-05-18 NOTE — ED Provider Notes (Signed)
Medical screening examination/treatment/procedure(s) were performed by non-physician practitioner and as supervising physician I was immediately available for consultation/collaboration.   Shelda Jakes, MD 05/18/12 [phone]

## 2012-06-02 ENCOUNTER — Encounter (HOSPITAL_COMMUNITY): Payer: Self-pay | Admitting: Emergency Medicine

## 2012-06-02 ENCOUNTER — Emergency Department (HOSPITAL_COMMUNITY)
Admission: EM | Admit: 2012-06-02 | Discharge: 2012-06-02 | Disposition: A | Discharge status: Home / Self Care | Payer: Medicaid Other | Attending: Emergency Medicine | Admitting: Emergency Medicine

## 2012-06-02 DIAGNOSIS — F17200 Nicotine dependence, unspecified, uncomplicated: Secondary | ICD-10-CM | POA: Insufficient documentation

## 2012-06-02 DIAGNOSIS — I10 Essential (primary) hypertension: Secondary | ICD-10-CM | POA: Insufficient documentation

## 2012-06-02 DIAGNOSIS — K089 Disorder of teeth and supporting structures, unspecified: Secondary | ICD-10-CM | POA: Insufficient documentation

## 2012-06-02 DIAGNOSIS — G8929 Other chronic pain: Secondary | ICD-10-CM | POA: Insufficient documentation

## 2012-06-02 DIAGNOSIS — K0889 Other specified disorders of teeth and supporting structures: Secondary | ICD-10-CM

## 2012-06-02 MED ORDER — IBUPROFEN 800 MG PO TABS
800.0000 mg | ORAL_TABLET | Freq: Once | ORAL | Status: AC
  Administered 2012-06-02: 800 mg via ORAL
  Filled 2012-06-02: qty 1

## 2012-06-02 MED ORDER — HYDROCODONE-ACETAMINOPHEN 5-325 MG PO TABS
1.0000 | ORAL_TABLET | Freq: Once | ORAL | Status: AC
  Administered 2012-06-02: 1 via ORAL
  Filled 2012-06-02: qty 1

## 2012-06-02 MED ORDER — PENICILLIN V POTASSIUM 250 MG PO TABS
500.0000 mg | ORAL_TABLET | Freq: Once | ORAL | Status: AC
  Administered 2012-06-02: 500 mg via ORAL
  Filled 2012-06-02: qty 2

## 2012-06-02 MED ORDER — PENICILLIN V POTASSIUM 500 MG PO TABS: 500.0000 mg | ORAL_TABLET | Freq: Four times a day (QID) | ORAL | Status: AC

## 2012-06-02 MED ORDER — HYDROCODONE-ACETAMINOPHEN 5-325 MG PO TABS
1.0000 | ORAL_TABLET | Freq: Four times a day (QID) | ORAL | Status: AC | PRN
Start: 2012-06-02 — End: 2012-06-12

## 2012-06-02 NOTE — Discharge Instructions (Signed)
Dental Pain A tooth ache may be caused by cavities (tooth decay). Cavities expose the nerve of the tooth to air and hot or cold temperatures. It may come from an infection or abscess (also called a boil or furuncle) around your tooth. It is also often caused by dental caries (tooth decay). This causes the pain you are having. DIAGNOSIS  Your caregiver can diagnose this problem by exam. TREATMENT   If caused by an infection, it may be treated with medications which kill germs (antibiotics) and pain medications as prescribed by your caregiver. Take medications as directed.   Only take over-the-counter or prescription medicines for pain, discomfort, or fever as directed by your caregiver.   Whether the tooth ache today is caused by infection or dental disease, you should see your dentist as soon as possible for further care.  SEEK MEDICAL CARE IF: The exam and treatment you received today has been provided on an emergency basis only. This is not a substitute for complete medical or dental care. If your problem worsens or new problems (symptoms) appear, and you are unable to meet with your dentist, call or return to this location. SEEK IMMEDIATE MEDICAL CARE IF:   You have a fever.   You develop redness and swelling of your face, jaw, or neck.   You are unable to open your mouth.   You have severe pain uncontrolled by pain medicine.  MAKE SURE YOU:   Understand these instructions.   Will watch your condition.   Will get help right away if you are not doing well or get worse.  Document Released: 11/27/2005 Document Revised: 11/16/2011 Document Reviewed: 07/15/2008 [employer] Patient Information 2012 [employer].take the meds as directed.  Take no more than 800 mg of ibuprofen every 8 hrs with food.  Follow up with the oral surgeon as planned or sooner if possible.

## 2012-06-02 NOTE — ED Provider Notes (Signed)
History     CSN: [id number]  Arrival date & time 06/02/12  0810   First MD Initiated Contact with Patient 06/02/12 [phone]      Chief Complaint  Patient presents with  . Dental Pain    (Consider location/radiation/quality/duration/timing/severity/associated sxs/prior treatment) HPI Comments: Here ~ 2 weeks ago for same complaint.  Reportedly has appt with oral surgeon in early July.  Patient is a 41 y.o. female presenting with tooth pain. The history is provided by the patient. No language interpreter was used.  Dental PainThe primary symptoms include mouth pain. Primary symptoms do not include dental injury or fever. Episode onset: about 1 month ago. The symptoms are unchanged. The symptoms occur constantly.  Additional symptoms do not include: jaw pain, facial swelling, drooling, ear pain and swollen glands.    Past Medical History  Diagnosis Date  . Hypertension   . Migraine   . Chronic back pain     Past Surgical History  Procedure Date  . Abdominal hysterectomy     No family history on file.  History  Substance Use Topics  . Smoking status: Current Everyday Smoker -- 2.0 packs/day    Types: Cigarettes  . Smokeless tobacco: Not on file  . Alcohol Use: No    OB History    Grav Para Term Preterm Abortions TAB SAB Ect Mult Living                  Review of Systems  Constitutional: Negative for fever and chills.  HENT: Positive for dental problem. Negative for ear pain, facial swelling and drooling.   Cardiovascular: Negative for chest pain.  Gastrointestinal: Negative for nausea and vomiting.  All other systems reviewed and are negative.    Allergies  Review of patient's allergies indicates no known allergies.  Home Medications   Current Outpatient Rx  Name Route Sig Dispense Refill  . HYDROCOD POLST-CPM POLST ER 10-8 MG/5ML PO LQCR Oral Take 5 mLs by mouth every 12 (twelve) hours. 75 mL 0  . HYDROCODONE-ACETAMINOPHEN 5-325 MG PO TABS Oral Take 1  tablet by mouth every 6 (six) hours as needed for pain. 20 tablet 0  . IBUPROFEN 200 MG PO TABS Oral Take 800 mg by mouth every 6 (six) hours as needed. Pain    . LISINOPRIL 20 MG PO TABS Oral Take 1 tablet (20 mg total) by mouth daily. 30 tablet 1  . LISINOPRIL 20 MG PO TABS Oral Take 1 tablet (20 mg total) by mouth daily. 30 tablet 0  . PENICILLIN V POTASSIUM 500 MG PO TABS Oral Take 1 tablet (500 mg total) by mouth 4 (four) times daily. 40 tablet 0  . PREDNISONE 10 MG PO TABS  6, 5, 4, 3, 2 then 1 tablet by mouth daily for 6 days total. 21 tablet 0  . PREDNISONE 10 MG PO TABS  Take one pill a day 7 tablet 0    BP 133/103  Pulse 84  Temp 98.2 F (36.8 C)  Resp 16  Ht 5\' 7"  (1.702 m)  Wt 163 lb (73.936 kg)  BMI 25.53 kg/m2  SpO2 99%  LMP 01/29/2012  Physical Exam  Nursing note and vitals reviewed. Constitutional: She is oriented to person, place, and time. She appears well-developed and well-nourished. No distress.  HENT:  Head: Normocephalic and atraumatic.  Mouth/Throat: Uvula is midline and mucous membranes are normal. No dental abscesses or uvula swelling.    Eyes: EOM are normal.  Neck: Normal range of motion.  Cardiovascular: Normal rate, regular rhythm and normal heart sounds.   Pulmonary/Chest: Effort normal and breath sounds normal.  Abdominal: Soft. She exhibits no distension. There is no tenderness.  Musculoskeletal: Normal range of motion.  Neurological: She is alert and oriented to person, place, and time.  Skin: Skin is warm and dry.  Psychiatric: She has a normal mood and affect. Judgment normal.    ED Course  Procedures (including critical care time)  Labs Reviewed - No data to display No results found.   1. Pain, dental       MDM  No obvious abscess rx-pen VK 500 mg, 40 rx-hydrocodone, 20 OTC ibuprofen F/u with oral surgeon as planned.        Worthy Rancher, PA 06/02/12 [phone]

## 2012-06-02 NOTE — ED Notes (Signed)
Pt c/o left upper toothache x 1 month, worse last night and today. Appointment with oral surgeon on 06/17/12.

## 2012-06-04 NOTE — ED Provider Notes (Signed)
Medical screening examination/treatment/procedure(s) were performed by non-physician practitioner and as supervising physician I was immediately available for consultation/collaboration.   Shelda Jakes, MD 06/04/12 1025

## 2012-06-10 ENCOUNTER — Encounter (HOSPITAL_COMMUNITY): Payer: Self-pay | Admitting: *Deleted

## 2012-06-10 ENCOUNTER — Emergency Department (HOSPITAL_COMMUNITY)
Admission: EM | Admit: 2012-06-10 | Discharge: 2012-06-10 | Disposition: A | Discharge status: Home / Self Care | Payer: Medicaid Other | Attending: Emergency Medicine | Admitting: Emergency Medicine

## 2012-06-10 DIAGNOSIS — Z90710 Acquired absence of both cervix and uterus: Secondary | ICD-10-CM | POA: Insufficient documentation

## 2012-06-10 DIAGNOSIS — I10 Essential (primary) hypertension: Secondary | ICD-10-CM | POA: Insufficient documentation

## 2012-06-10 DIAGNOSIS — G43909 Migraine, unspecified, not intractable, without status migrainosus: Secondary | ICD-10-CM | POA: Insufficient documentation

## 2012-06-10 DIAGNOSIS — K089 Disorder of teeth and supporting structures, unspecified: Secondary | ICD-10-CM | POA: Insufficient documentation

## 2012-06-10 DIAGNOSIS — K0889 Other specified disorders of teeth and supporting structures: Secondary | ICD-10-CM

## 2012-06-10 DIAGNOSIS — F17200 Nicotine dependence, unspecified, uncomplicated: Secondary | ICD-10-CM | POA: Insufficient documentation

## 2012-06-10 MED ORDER — HYDROCODONE-ACETAMINOPHEN 5-325 MG PO TABS: 1.0000 | ORAL_TABLET | Freq: Four times a day (QID) | ORAL | Status: AC | PRN

## 2012-06-10 MED ORDER — HYDROCODONE-ACETAMINOPHEN 5-325 MG PO TABS
1.0000 | ORAL_TABLET | Freq: Once | ORAL | Status: AC
  Administered 2012-06-10: 1 via ORAL
  Filled 2012-06-10: qty 1

## 2012-06-10 MED ORDER — IBUPROFEN 800 MG PO TABS
800.0000 mg | ORAL_TABLET | Freq: Once | ORAL | Status: AC
  Administered 2012-06-10: 800 mg via ORAL
  Filled 2012-06-10: qty 1

## 2012-06-10 NOTE — Discharge Instructions (Signed)
Dental Pain A tooth ache may be caused by cavities (tooth decay). Cavities expose the nerve of the tooth to air and hot or cold temperatures. It may come from an infection or abscess (also called a boil or furuncle) around your tooth. It is also often caused by dental caries (tooth decay). This causes the pain you are having. DIAGNOSIS  Your caregiver can diagnose this problem by exam. TREATMENT   If caused by an infection, it may be treated with medications which kill germs (antibiotics) and pain medications as prescribed by your caregiver. Take medications as directed.   Only take over-the-counter or prescription medicines for pain, discomfort, or fever as directed by your caregiver.   Whether the tooth ache today is caused by infection or dental disease, you should see your dentist as soon as possible for further care.  SEEK MEDICAL CARE IF: The exam and treatment you received today has been provided on an emergency basis only. This is not a substitute for complete medical or dental care. If your problem worsens or new problems (symptoms) appear, and you are unable to meet with your dentist, call or return to this location. SEEK IMMEDIATE MEDICAL CARE IF:   You have a fever.   You develop redness and swelling of your face, jaw, or neck.   You are unable to open your mouth.   You have severe pain uncontrolled by pain medicine.  MAKE SURE YOU:   Understand these instructions.   Will watch your condition.   Will get help right away if you are not doing well or get worse.  Document Released: 11/27/2005 Document Revised: 11/16/2011 Document Reviewed: 07/15/2008 [facility] Patient Information 2012 [place], [place].   Take the penicillin and pain meds as directed.  Take ibuprofen 800 mg every 8 hrs with food.  Follow up with the dentist/oral surgeon as planned.

## 2012-06-10 NOTE — ED Provider Notes (Signed)
History     CSN: [id number]  Arrival date & time 06/10/12  1603   First MD Initiated Contact with Patient 06/10/12 1620      Chief Complaint  Patient presents with  . Dental Pain    (Consider location/radiation/quality/duration/timing/severity/associated sxs/prior treatment) HPI Comments: This is 3rd visit for same tooth hurting.  Has appt  July 8 for extraction by oral surgeon.  Seen several days ago by dentist who refused to extract tooth b/c "my blood pressure is high".  Still taking pen VK.  Out of pain medicine.  No fever or chills.  Patient is a 41 y.o. female presenting with tooth pain. The history is provided by the patient. No language interpreter was used.  Dental PainThe primary symptoms include mouth pain. Primary symptoms do not include fever. Episode onset: ~ 3 weeks ago. The symptoms are unchanged. The symptoms occur constantly.  Additional symptoms include: dental sensitivity to temperature. Medical issues include: smoking.    Past Medical History  Diagnosis Date  . Hypertension   . Migraine   . Chronic back pain     Past Surgical History  Procedure Date  . Abdominal hysterectomy     History reviewed. No pertinent family history.  History  Substance Use Topics  . Smoking status: Current Everyday Smoker -- 2.0 packs/day    Types: Cigarettes  . Smokeless tobacco: Not on file  . Alcohol Use: No    OB History    Grav Para Term Preterm Abortions TAB SAB Ect Mult Living                  Review of Systems  Constitutional: Negative for fever and chills.  HENT: Positive for dental problem.   Cardiovascular: Negative for chest pain.  All other systems reviewed and are negative.    Allergies  Review of patient's allergies indicates no known allergies.  Home Medications   Current Outpatient Rx  Name Route Sig Dispense Refill  . HYDROCOD POLST-CPM POLST ER 10-8 MG/5ML PO LQCR Oral Take 5 mLs by mouth every 12 (twelve) hours. 75 mL 0  .  HYDROCODONE-ACETAMINOPHEN 5-325 MG PO TABS Oral Take 1 tablet by mouth every 6 (six) hours as needed for pain. 20 tablet 0  . IBUPROFEN 200 MG PO TABS Oral Take 800 mg by mouth every 6 (six) hours as needed. Pain    . LISINOPRIL 20 MG PO TABS Oral Take 1 tablet (20 mg total) by mouth daily. 30 tablet 1  . LISINOPRIL 20 MG PO TABS Oral Take 1 tablet (20 mg total) by mouth daily. 30 tablet 0  . PENICILLIN V POTASSIUM 500 MG PO TABS Oral Take 1 tablet (500 mg total) by mouth 4 (four) times daily. 40 tablet 0  . PREDNISONE 10 MG PO TABS  6, 5, 4, 3, 2 then 1 tablet by mouth daily for 6 days total. 21 tablet 0  . PREDNISONE 10 MG PO TABS  Take one pill a day 7 tablet 0    BP 142/107  Pulse 105  Temp 98 F (36.7 C) (Oral)  Resp 20  Ht 5\' 7"  (1.702 m)  Wt 163 lb (73.936 kg)  BMI 25.53 kg/m2  SpO2 100%  LMP 01/29/2012  Physical Exam  Nursing note and vitals reviewed. Constitutional: She is oriented to person, place, and time. She appears well-developed and well-nourished. No distress.  HENT:  Head: Normocephalic and atraumatic.  Mouth/Throat: Uvula is midline and mucous membranes are normal. Dental caries present. No dental  abscesses or uvula swelling. No oropharyngeal exudate, posterior oropharyngeal edema, posterior oropharyngeal erythema or tonsillar abscesses.    Eyes: EOM are normal.  Neck: Normal range of motion.  Cardiovascular: Normal rate, regular rhythm and normal heart sounds.   Pulmonary/Chest: Effort normal and breath sounds normal.  Abdominal: Soft. She exhibits no distension. There is no tenderness.  Musculoskeletal: Normal range of motion.  Neurological: She is alert and oriented to person, place, and time.  Skin: Skin is warm and dry.  Psychiatric: She has a normal mood and affect. Judgment normal.    ED Course  Procedures (including critical care time)  Labs Reviewed - No data to display No results found.   No diagnosis found.    MDM  No obvious  abscess. Continue pen VK rx-hydrocodone, 20 OTC ibuprofen 800 mg TID F/u with dentist.        Evalina Field, PA 06/10/12 1640

## 2012-06-10 NOTE — ED Notes (Signed)
Patient relates she has two teeth on L side (one upper one lower) and one tooth on lower R side that are slated to be pulled by oral surgeon 06/17/12.  Pain has become unbearable at present.  Last time in ER recv'd. Lortab 5mg  which she states did not help.

## 2012-06-10 NOTE — ED Notes (Signed)
Dental pain for 1month.

## 2012-06-11 NOTE — ED Provider Notes (Signed)
Medical screening examination/treatment/procedure(s) were performed by non-physician practitioner and as supervising physician I was immediately available for consultation/collaboration.   Louella Medaglia W. Raini Tiley, MD 06/11/12 1937 

## 2012-07-20 ENCOUNTER — Encounter (HOSPITAL_COMMUNITY): Payer: Self-pay

## 2012-07-20 ENCOUNTER — Emergency Department (HOSPITAL_COMMUNITY)
Admission: EM | Admit: 2012-07-20 | Discharge: 2012-07-21 | Disposition: A | Discharge status: Home / Self Care | Payer: Medicaid Other | Attending: Emergency Medicine | Admitting: Emergency Medicine

## 2012-07-20 DIAGNOSIS — K0889 Other specified disorders of teeth and supporting structures: Secondary | ICD-10-CM

## 2012-07-20 DIAGNOSIS — F17200 Nicotine dependence, unspecified, uncomplicated: Secondary | ICD-10-CM | POA: Insufficient documentation

## 2012-07-20 DIAGNOSIS — I10 Essential (primary) hypertension: Secondary | ICD-10-CM | POA: Insufficient documentation

## 2012-07-20 DIAGNOSIS — K029 Dental caries, unspecified: Secondary | ICD-10-CM | POA: Insufficient documentation

## 2012-07-20 NOTE — ED Notes (Signed)
Pt having problem w/ upper left teeth for months. Missed appointment & has another scheduled for Thursday.

## 2012-07-20 NOTE — ED Notes (Signed)
Left upper dental pain x 6 months, states she has dental appt for this week

## 2012-07-20 NOTE — ED Provider Notes (Signed)
History     CSN: [id number]  Arrival date & time 07/20/12  2336   None     Chief Complaint  Patient presents with  . Dental Pain   HPI Molly Munoz is a 41 y.o. female who presents to the ED with dental pain. The pain started about a month ago. She has an appointment with the oral surgeon in [facility] 07/25/12. The pain is located in the left upper. Associated symptoms include facial pain and headache.  She rates the pain as 10/10. She denies any other problems. The history was provided by the patient.  Past Medical History  Diagnosis Date  . Hypertension   . Migraine   . Chronic back pain     Past Surgical History  Procedure Date  . Abdominal hysterectomy     No family history on file.  History  Substance Use Topics  . Smoking status: Current Everyday Smoker -- 2.0 packs/day    Types: Cigarettes  . Smokeless tobacco: Not on file  . Alcohol Use: No    OB History    Grav Para Term Preterm Abortions TAB SAB Ect Mult Living                  Review of Systems: as stated in HPI  Allergies  Review of patient's allergies indicates no known allergies.  Home Medications   Current Outpatient Rx  Name Route Sig Dispense Refill  . HYDROCOD POLST-CPM POLST ER 10-8 MG/5ML PO LQCR Oral Take 5 mLs by mouth every 12 (twelve) hours. 75 mL 0  . IBUPROFEN 200 MG PO TABS Oral Take 800 mg by mouth every 6 (six) hours as needed. Pain    . LISINOPRIL 20 MG PO TABS Oral Take 1 tablet (20 mg total) by mouth daily. 30 tablet 1  . LISINOPRIL 20 MG PO TABS Oral Take 1 tablet (20 mg total) by mouth daily. 30 tablet 0  . PREDNISONE 10 MG PO TABS  6, 5, 4, 3, 2 then 1 tablet by mouth daily for 6 days total. 21 tablet 0  . PREDNISONE 10 MG PO TABS  Take one pill a day 7 tablet 0    BP 195/116  Pulse 65  Temp 98.6 F (37 C) (Oral)  Resp 20  SpO2 100%  LMP 01/29/2012  Physical Exam  Nursing note and vitals reviewed. Constitutional: She is oriented to person, place, and time. She  appears well-developed and well-nourished. No distress.       Uncomfortable appearing. Holding left side of face.  HENT:  Head: Normocephalic.  Mouth/Throat: Uvula is midline and oropharynx is clear and moist. Dental caries present. No posterior oropharyngeal edema or posterior oropharyngeal erythema.         Tender upper left.  Eyes: EOM are normal.  Neck: Neck supple.  Cardiovascular: Normal rate.   Pulmonary/Chest: Effort normal. No respiratory distress.  Musculoskeletal: Normal range of motion.  Neurological: She is alert and oriented to person, place, and time. No cranial nerve deficit.  Skin: Skin is warm and dry.  Psychiatric: She has a normal mood and affect. Her behavior is normal. Judgment and thought content normal.   Assessment: Dental pain  Plan:  Pen VK 500 mg   Percocet 5/325   Keep appointment with oral surgeon Medication List  As of 07/21/2012 12:11 AM   START taking these medications         oxyCODONE-acetaminophen 5-325 MG per tablet   Commonly known as: PERCOCET/ROXICET  Take 2 tablets by mouth every 4 (four) hours as needed for pain.      penicillin v potassium 500 MG tablet   Commonly known as: VEETID   Take 1 tablet (500 mg total) by mouth 3 (three) times daily.         ASK your doctor about these medications         chlorpheniramine-HYDROcodone 10-8 MG/5ML Lqcr   Commonly known as: TUSSIONEX   Take 5 mLs by mouth every 12 (twelve) hours.      ibuprofen 200 MG tablet   Commonly known as: ADVIL,MOTRIN      * lisinopril 20 MG tablet   Commonly known as: PRINIVIL,ZESTRIL   Take 1 tablet (20 mg total) by mouth daily.      * lisinopril 20 MG tablet   Commonly known as: PRINIVIL,ZESTRIL   Take 1 tablet (20 mg total) by mouth daily.      * predniSONE 10 MG tablet   Commonly known as: DELTASONE   6, 5, 4, 3, 2 then 1 tablet by mouth daily for 6 days total.      * predniSONE 10 MG tablet   Commonly known as: DELTASONE   Take one pill a day      * Notice: This list has 4 medication(s) that are the same as other medications prescribed for you. Read the directions carefully, and ask your doctor or other care provider to review them with you.        Where to get your medications    These are the prescriptions that you need to pick up.   You may get these medications from any pharmacy.         oxyCODONE-acetaminophen 5-325 MG per tablet   penicillin v potassium 500 MG tablet           Follow-up Information    Please follow up. (oral surgeon as scheduled)          I have discussed the clinical findings with the patient and need for follow up with the oral surgeon. Patient voices understanding. ED Course  Procedures   MDM          Janne Napoleon, NP 07/21/12 [phone]

## 2012-07-21 MED ORDER — PENICILLIN V POTASSIUM 250 MG PO TABS
500.0000 mg | ORAL_TABLET | Freq: Once | ORAL | Status: AC
  Administered 2012-07-21: 500 mg via ORAL
  Filled 2012-07-21: qty 2

## 2012-07-21 MED ORDER — OXYCODONE-ACETAMINOPHEN 5-325 MG PO TABS
2.0000 | ORAL_TABLET | ORAL | Status: AC | PRN
Start: 2012-07-21 — End: 2012-07-31

## 2012-07-21 MED ORDER — OXYCODONE-ACETAMINOPHEN 5-325 MG PO TABS
1.0000 | ORAL_TABLET | Freq: Once | ORAL | Status: AC
  Administered 2012-07-21: 1 via ORAL
  Filled 2012-07-21: qty 1

## 2012-07-21 MED ORDER — PENICILLIN V POTASSIUM 500 MG PO TABS: 500.0000 mg | ORAL_TABLET | Freq: Three times a day (TID) | ORAL | Status: AC

## 2012-07-21 NOTE — ED Provider Notes (Signed)
Medical screening examination/treatment/procedure(s) were performed by non-physician practitioner and as supervising physician I was immediately available for consultation/collaboration.  Cherye Gaertner, MD 07/21/12 0542 

## 2012-07-21 NOTE — ED Notes (Signed)
Pt alert & oriented x4, stable gait. Patient  given discharge instructions, paperwork & prescription(s). Patient verbalized understanding. Pt left department w/ no further questions. 

## 2012-08-17 ENCOUNTER — Emergency Department (HOSPITAL_COMMUNITY)
Admission: EM | Admit: 2012-08-17 | Discharge: 2012-08-17 | Disposition: A | Discharge status: Home / Self Care | Payer: Medicaid Other | Attending: Emergency Medicine | Admitting: Emergency Medicine

## 2012-08-17 ENCOUNTER — Encounter (HOSPITAL_COMMUNITY): Payer: Self-pay | Admitting: Emergency Medicine

## 2012-08-17 DIAGNOSIS — I10 Essential (primary) hypertension: Secondary | ICD-10-CM | POA: Insufficient documentation

## 2012-08-17 DIAGNOSIS — K029 Dental caries, unspecified: Secondary | ICD-10-CM | POA: Insufficient documentation

## 2012-08-17 DIAGNOSIS — K0889 Other specified disorders of teeth and supporting structures: Secondary | ICD-10-CM

## 2012-08-17 DIAGNOSIS — F17200 Nicotine dependence, unspecified, uncomplicated: Secondary | ICD-10-CM | POA: Insufficient documentation

## 2012-08-17 MED ORDER — OXYCODONE-ACETAMINOPHEN 5-325 MG PO TABS: ORAL_TABLET | ORAL | Status: DC

## 2012-08-17 MED ORDER — OXYCODONE-ACETAMINOPHEN 5-325 MG PO TABS
1.0000 | ORAL_TABLET | Freq: Once | ORAL | Status: AC
  Administered 2012-08-17: 1 via ORAL
  Filled 2012-08-17: qty 1

## 2012-08-17 MED ORDER — PENICILLIN V POTASSIUM 250 MG PO TABS: 250.0000 mg | ORAL_TABLET | Freq: Four times a day (QID) | ORAL | Status: AC

## 2012-08-17 MED ORDER — NAPROXEN 250 MG PO TABS: 250.0000 mg | ORAL_TABLET | Freq: Two times a day (BID) | ORAL | Status: AC

## 2012-08-17 MED ORDER — IBUPROFEN 400 MG PO TABS
400.0000 mg | ORAL_TABLET | Freq: Once | ORAL | Status: AC
  Administered 2012-08-17: 400 mg via ORAL
  Filled 2012-08-17: qty 1

## 2012-08-17 NOTE — ED Notes (Signed)
Patient complaining of left upper dental pain that she reports has been bothering her for a while. Reports is getting tooth pulled Wednesday.

## 2012-08-17 NOTE — ED Provider Notes (Signed)
History     CSN: [id number]  Arrival date & time 08/17/12  2137   First MD Initiated Contact with Patient 08/17/12 2151      Chief Complaint  Patient presents with  . Dental Pain     HPI Pt was seen at 2150.  Per pt, c/o gradual onset and persistence of constant left upper tooth "pain" for the past several weeks.  States she has an appt with her dentist on Wednesday to "pull my tooth." Denies fevers, no intra-oral edema, no rash, no facial swelling, no dysphagia, no neck pain.   The condition is aggravated by nothing. The condition is relieved by nothing. The symptoms have been associated with no other complaints. The patient has no significant history of serious medical conditions.     Past Medical History  Diagnosis Date  . Hypertension   . Migraine   . Chronic back pain     Past Surgical History  Procedure Date  . Abdominal hysterectomy     History  Substance Use Topics  . Smoking status: Current Everyday Smoker -- 2.0 packs/day    Types: Cigarettes  . Smokeless tobacco: Not on file  . Alcohol Use: No    Review of Systems ROS: Statement: All systems negative except as marked or noted in the HPI; Constitutional: Negative for fever and chills. ; ; Eyes: Negative for eye pain and discharge. ; ; ENMT: Positive for dental caries, dental hygiene poor and toothache. Negative for ear pain, bleeding gums, dental injury, facial deformity, facial swelling, hoarseness, nasal congestion, sinus pressure, sore throat, throat swelling and tongue swollen. ; ; Cardiovascular: Negative for chest pain, palpitations, diaphoresis, dyspnea and peripheral edema. ; ; Respiratory: Negative for cough, wheezing and stridor. ; ; Gastrointestinal: Negative for nausea, vomiting, diarrhea and abdominal pain. ; ; Genitourinary: Negative for dysuria, flank pain and hematuria. ; ; Musculoskeletal: Negative for back pain and neck pain. ; ; Skin: Negative for rash and skin lesion. ; ; Neuro: Negative for  headache, lightheadedness and neck stiffness.    Allergies  Review of patient's allergies indicates no known allergies.  Home Medications   Current Outpatient Rx  Name Route Sig Dispense Refill  . HYDROCOD POLST-CPM POLST ER 10-8 MG/5ML PO LQCR Oral Take 5 mLs by mouth every 12 (twelve) hours. 75 mL 0  . IBUPROFEN 200 MG PO TABS Oral Take 800 mg by mouth every 6 (six) hours as needed. Pain    . LISINOPRIL 20 MG PO TABS Oral Take 1 tablet (20 mg total) by mouth daily. 30 tablet 1  . LISINOPRIL 20 MG PO TABS Oral Take 1 tablet (20 mg total) by mouth daily. 30 tablet 0  . PREDNISONE 10 MG PO TABS  6, 5, 4, 3, 2 then 1 tablet by mouth daily for 6 days total. 21 tablet 0  . PREDNISONE 10 MG PO TABS  Take one pill a day 7 tablet 0    BP 183/95  Pulse 69  Temp 98.2 F (36.8 C) (Oral)  Resp 16  Ht 5\' 7"  (1.702 m)  Wt 163 lb (73.936 kg)  BMI 25.53 kg/m2  SpO2 100%  LMP 01/29/2012  Physical Exam 2155: Physical examination: Vital signs and O2 SAT: Reviewed; Constitutional: Well developed, Well nourished, Well hydrated, In no acute distress; Head and Face: Normocephalic, Atraumatic; Eyes: EOMI, PERRL, No scleral icterus; ENMT: Mouth and pharynx normal, Poor dentition, Widespread dental decay, Left TM normal, Right TM normal, Mucous membranes moist, +upper left 2nd premolar with  extensive dental decay.  No gingival erythema, edema, fluctuance, or drainage.  No hoarse voice, no drooling, no stridor.  ; Neck: Supple, Full range of motion, No lymphadenopathy; Cardiovascular: Regular rate and rhythm, No murmur, rub, or gallop; Respiratory: Breath sounds clear & equal bilaterally, No rales, rhonchi, wheezes, or rub, Normal respiratory effort/excursion; Chest: Nontender, Movement normal; Extremities: Pulses normal, No tenderness, No edema; Neuro: AA&Ox3, Major CN grossly intact.  No gross focal motor or sensory deficits in extremities.; Skin: Color normal, No rash, No petechiae, Warm, Dry   ED Course    Procedures    MDM  MDM Reviewed: nursing note and vitals     2200:  Pt encouraged to keep her f/u appt on Wednesday with her dentist Dr. Jeanice Lim in [place] for good continuity of care and definitive treatment.  Verb understanding.         Laray Anger, DO 08/19/12 1045

## 2012-08-17 NOTE — ED Notes (Signed)
Pt returns to= ED secondary to dental pain in left upper tooth. Pt states has dental appt the 11 th to have 5 teeth extracted. No swelling noted in face/jaw.

## 2012-08-25 ENCOUNTER — Emergency Department (HOSPITAL_COMMUNITY)
Admission: EM | Admit: 2012-08-25 | Discharge: 2012-08-25 | Disposition: A | Discharge status: Home / Self Care | Payer: Medicaid Other | Attending: Emergency Medicine | Admitting: Emergency Medicine

## 2012-08-25 ENCOUNTER — Encounter (HOSPITAL_COMMUNITY): Payer: Self-pay | Admitting: Emergency Medicine

## 2012-08-25 DIAGNOSIS — K0889 Other specified disorders of teeth and supporting structures: Secondary | ICD-10-CM

## 2012-08-25 DIAGNOSIS — F17200 Nicotine dependence, unspecified, uncomplicated: Secondary | ICD-10-CM | POA: Insufficient documentation

## 2012-08-25 DIAGNOSIS — I10 Essential (primary) hypertension: Secondary | ICD-10-CM | POA: Insufficient documentation

## 2012-08-25 DIAGNOSIS — G8929 Other chronic pain: Secondary | ICD-10-CM | POA: Insufficient documentation

## 2012-08-25 DIAGNOSIS — K089 Disorder of teeth and supporting structures, unspecified: Secondary | ICD-10-CM | POA: Insufficient documentation

## 2012-08-25 MED ORDER — TRAMADOL-ACETAMINOPHEN 37.5-325 MG PO TABS
ORAL_TABLET | ORAL | Status: AC
Start: 2012-08-25 — End: 2012-09-04

## 2012-08-25 MED ORDER — IBUPROFEN 800 MG PO TABS
800.0000 mg | ORAL_TABLET | Freq: Once | ORAL | Status: AC
  Administered 2012-08-25: 800 mg via ORAL

## 2012-08-25 MED ORDER — ACETAMINOPHEN 500 MG PO TABS
1000.0000 mg | ORAL_TABLET | Freq: Once | ORAL | Status: AC
  Administered 2012-08-25: 1000 mg via ORAL
  Filled 2012-08-25: qty 2

## 2012-08-25 MED ORDER — IBUPROFEN 800 MG PO TABS
ORAL_TABLET | ORAL | Status: AC
  Administered 2012-08-25: 800 mg via ORAL
  Filled 2012-08-25: qty 1

## 2012-08-25 MED ORDER — TRAMADOL HCL 50 MG PO TABS
100.0000 mg | ORAL_TABLET | Freq: Once | ORAL | Status: AC
  Administered 2012-08-25: 100 mg via ORAL
  Filled 2012-08-25: qty 2

## 2012-08-25 NOTE — ED Notes (Signed)
Pt c/o of top left tooth pain that is causing a headache that has been going on for about 4 months per pt. Swelling noted to left upper jaw. Has been taking an antibiotic prescribed by her dr.

## 2012-08-25 NOTE — ED Provider Notes (Cosign Needed)
History  This chart was scribed for Molly Givens, MD by Molly Munoz. This patient was seen in room APA15/APA15 and the patient's care was started at 17:16.   CSN: [id number]  Arrival date & time 08/25/12  1612   First MD Initiated Contact with Patient 08/25/12 1716      Chief Complaint  Patient presents with  . Dental Pain    (Consider location/radiation/quality/duration/timing/severity/associated sxs/prior treatment) The history is provided by the patient. No language interpreter was used.  Molly Munoz is a 41 y.o. female who presents to the Emergency Department complaining of dental pain, around a left top tooth, for the past 4 months, worse since last night. Pt reports associated headache and difficulty eating and drinking, but denies any fever. She states cold makes the pain worse. Pt reports she has been to the dentist to have it pulled twice, but he refused to pull it because her blood pressure was too high. Pt was put on Lisinopril this week to control her blood pressure. Pt is not on prednisone but is still on penicilin, and reports nothing relieves the pain. Pt has an appointment on the 3rd of October to see her dentist, Dr. Jeanice Munoz in [place], who plans to pull the tooth then. States she saw him in the office this week and didn't ask for more pain medications.   [facility] is the pt's PCP.  Past Medical History  Diagnosis Date  . Hypertension   . Migraine   . Chronic back pain     Past Surgical History  Procedure Date  . Abdominal hysterectomy     History reviewed. No pertinent family history.  History  Substance Use Topics  . Smoking status: Current Every Day Smoker -- 2.0 packs/day    Types: Cigarettes  . Smokeless tobacco: Not on file  . Alcohol Use: No   Pt smokes about 1 pack/day. She desn't drink much. 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  OB History    Grav Para Term Preterm Abortions TAB SAB Ect Mult Living                   Review of Systems  Constitutional: Negative for fever.  HENT: Positive for dental problem. Negative for sinus pressure.   Eyes: Negative for discharge.  Respiratory: Negative for cough.   Cardiovascular: Negative for chest pain.  Gastrointestinal: Negative for abdominal pain.  Genitourinary: Negative for frequency.  Musculoskeletal: Negative for back pain.  Skin: Negative for rash.  Neurological: Positive for headaches.  Hematological: Negative.   Psychiatric/Behavioral: Negative for hallucinations.  All other systems reviewed and are negative.    Allergies  Review of patient's allergies indicates no known allergies.  Home Medications   Current Outpatient Rx  Name Route Sig Dispense Refill  . IBUPROFEN 200 MG PO TABS Oral Take 800 mg by mouth every 6 (six) hours as needed. Pain    . LISINOPRIL 20 MG PO TABS Oral Take 1 tablet (20 mg total) by mouth daily. 30 tablet 0  . NAPROXEN 250 MG PO TABS Oral Take 1 tablet (250 mg total) by mouth 2 (two) times daily with a meal. 14 tablet 0  . PENICILLIN V POTASSIUM 250 MG PO TABS Oral Take 1 tablet (250 mg total) by mouth 4 (four) times daily. 20 tablet 0    Triage Vitals: BP 182/100  Pulse 75  Temp 98.3 F (36.8 C) (Oral)  Ht 5\' 7"  (1.702 m)  Wt 160  lb (72.576 kg)  BMI 25.06 kg/m2  SpO2 100%  LMP 01/28/19  Vital signs normal except for   Physical Exam  Nursing note and vitals reviewed. Constitutional: She is oriented to person, place, and time. She appears well-developed and well-nourished.  Non-toxic appearance. She does not appear ill. No distress.  HENT:  Head: Normocephalic and atraumatic.  Right Ear: External ear normal.  Left Ear: External ear normal.  Nose: Nose normal. No mucosal edema or rhinorrhea.  Mouth/Throat: Oropharynx is clear and moist and mucous membranes are normal. No dental abscesses or uvula swelling.       #13 upper left tooth has a filling and the back portion of it looks like it has been  broken off. No gum swelling. No other diffuse dental disease seen.  Eyes: Conjunctivae normal and EOM are normal. Pupils are equal, round, and reactive to light.  Neck: Normal range of motion and full passive range of motion without pain. Neck supple.  Cardiovascular: Normal rate, regular rhythm and normal heart sounds.  Exam reveals no gallop and no friction rub.   No murmur heard. Pulmonary/Chest: Effort normal and breath sounds normal. No respiratory distress. She has no wheezes. She has no rhonchi. She has no rales. She exhibits no tenderness and no crepitus.  Abdominal: Soft. Normal appearance and bowel sounds are normal. She exhibits no distension. There is no tenderness. There is no rebound and no guarding.  Musculoskeletal: Normal range of motion. She exhibits no edema and no tenderness.       Moves all extremities well.   Neurological: She is alert and oriented to person, place, and time. She has normal strength. No cranial nerve deficit.  Skin: Skin is warm, dry and intact. No rash noted. No erythema. No pallor.  Psychiatric: She has a normal mood and affect. Her speech is normal and behavior is normal. Her mood appears not anxious.    ED Course  Procedures (including critical care time)   Medications  traMADol (ULTRAM) tablet 100 mg (not administered)  acetaminophen (TYLENOL) tablet 1,000 mg (not administered)  ibuprofen (ADVIL,MOTRIN) tablet 800 mg (800 mg Oral Given 08/25/12 1640)    Patient has had 10 ED visits in past 6 months. She's been seen for back pain on March 13, April 16, April 22, and April 30. She's been seen for toothache on February 18, June 8, June 23, July 1. She was seen August 10th and said she had an appointment the dentist on August 15. She was seen again on September 7 and said she had a upcoming appointment with the dentist in her teeth pulled. Review of [place] controlled substance site show she's had 15 narcotic prescriptions in the past 6 months.  Many are from our emergency department and from 2 dentist, Dr. Jeanice Munoz and Dr. Blondell Munoz.  DIAGNOSTIC STUDIES: Oxygen Saturation is 100% on room air, normal by my interpretation.    COORDINATION OF CARE: 17:52--I evaluated the patient and we discussed a treatment plan including medicaiton to which the pt agreed.        1. Toothache     New Prescriptions   TRAMADOL-ACETAMINOPHEN (ULTRACET) 37.5-325 MG PER TABLET    2 tabs po QID prn pain  ibuprofen 600 mg QID  Plan discharge  Devoria Albe, MD, FACEP   MDM   I personally performed the services described in this documentation, which was scribed in my presence. The recorded information has been reviewed and considered.  Devoria Albe, MD, Armando Gang  Molly Givens, MD 08/25/12 [phone]

## 2012-08-25 NOTE — ED Notes (Signed)
In with other nurse to give meds. Pt very upset and angry that all she was getting was tramadol and tylenol. Pt refused meds and states " this shit aint gonna do any good". Advised pt we could let doctor know. Pt states "just give it to me , Ill take it". Pt aggitated. edp aware

## 2012-09-17 ENCOUNTER — Emergency Department (HOSPITAL_COMMUNITY)
Admission: EM | Admit: 2012-09-17 | Discharge: 2012-09-17 | Disposition: A | Discharge status: Home / Self Care | Payer: Medicaid Other | Attending: Emergency Medicine | Admitting: Emergency Medicine

## 2012-09-17 DIAGNOSIS — M549 Dorsalgia, unspecified: Secondary | ICD-10-CM | POA: Insufficient documentation

## 2012-09-17 DIAGNOSIS — K089 Disorder of teeth and supporting structures, unspecified: Secondary | ICD-10-CM | POA: Insufficient documentation

## 2012-09-17 DIAGNOSIS — I10 Essential (primary) hypertension: Secondary | ICD-10-CM

## 2012-09-17 DIAGNOSIS — K0889 Other specified disorders of teeth and supporting structures: Secondary | ICD-10-CM

## 2012-09-17 DIAGNOSIS — F17200 Nicotine dependence, unspecified, uncomplicated: Secondary | ICD-10-CM | POA: Insufficient documentation

## 2012-09-17 DIAGNOSIS — G43909 Migraine, unspecified, not intractable, without status migrainosus: Secondary | ICD-10-CM | POA: Insufficient documentation

## 2012-09-17 MED ORDER — HYDROCODONE-ACETAMINOPHEN 5-325 MG PO TABS
2.0000 | ORAL_TABLET | Freq: Once | ORAL | Status: AC
  Administered 2012-09-17: 2 via ORAL
  Filled 2012-09-17: qty 2

## 2012-09-17 MED ORDER — PENICILLIN V POTASSIUM 250 MG PO TABS
500.0000 mg | ORAL_TABLET | Freq: Once | ORAL | Status: AC
  Administered 2012-09-17: 500 mg via ORAL
  Filled 2012-09-17: qty 2

## 2012-09-17 MED ORDER — HYDROCODONE-ACETAMINOPHEN 5-325 MG PO TABS
1.0000 | ORAL_TABLET | ORAL | Status: AC | PRN
Start: 2012-09-17 — End: 2012-09-27

## 2012-09-17 MED ORDER — METOPROLOL TARTRATE 25 MG PO TABS: 25.0000 mg | ORAL_TABLET | Freq: Two times a day (BID) | ORAL | Status: DC

## 2012-09-17 MED ORDER — METOPROLOL TARTRATE 50 MG PO TABS
50.0000 mg | ORAL_TABLET | Freq: Once | ORAL | Status: AC
  Administered 2012-09-17: 50 mg via ORAL
  Filled 2012-09-17: qty 1

## 2012-09-17 MED ORDER — CLONIDINE HCL 0.2 MG PO TABS
0.2000 mg | ORAL_TABLET | Freq: Once | ORAL | Status: AC
  Administered 2012-09-17: 0.2 mg via ORAL
  Filled 2012-09-17: qty 1

## 2012-09-17 MED ORDER — LISINOPRIL 20 MG PO TABS: 20.0000 mg | ORAL_TABLET | Freq: Two times a day (BID) | ORAL | Status: DC

## 2012-09-17 MED ORDER — IBUPROFEN 800 MG PO TABS
800.0000 mg | ORAL_TABLET | Freq: Once | ORAL | Status: AC
  Administered 2012-09-17: 800 mg via ORAL
  Filled 2012-09-17: qty 1

## 2012-09-17 NOTE — ED Notes (Signed)
States she has been having dental pain for over 90 days and hasn't been able to get her tooth pulled because her blood pressure has been too high.

## 2012-09-17 NOTE — ED Notes (Signed)
MD made aware of patient's blood pressure.

## 2012-09-17 NOTE — ED Notes (Signed)
Pt counseled regarding the importance of following up and finding a primary care physician to follow her blood pressure.  Consequences of unmanaged high blood pressure were discussed at length with the patient, who was uninterested.

## 2012-09-17 NOTE — ED Provider Notes (Signed)
History     CSN: [id number]  Arrival date & time 09/17/12  0129   First MD Initiated Contact with Patient 09/17/12 [phone]      Chief Complaint  Patient presents with  . Dental Pain    (Consider location/radiation/quality/duration/timing/severity/associated sxs/prior treatment) HPI   Molly Munoz is a 41 y.o. female who presents to the Emergency Department complaining of persistent dental pain. She has been seen by two dentist and seen multiple times in the ER for dental pain. Dentist is reluctant to pull teeth due to elevated blood pressure. She was told she would need a PCP and medical clearance for dental procedures. She has been unable to find a doctor that will take Medicaid. Last seen by a dentist on 09/12/12. She was given antihypertensives in one of her recent visits to the ER but has continues to have elevated blood pressures at all of her subsequent visits. She denies fever, chills. Pain to her teeth is worse when she eats cold items and with cold air. She has completed a course of antibiotics x 2 for the development of swelling in and around two of the teeth. Currently her pain in on the left upper jaw.   Past Medical History  Diagnosis Date  . Hypertension   . Migraine   . Chronic back pain     Past Surgical History  Procedure Date  . Abdominal hysterectomy     No family history on file.  History  Substance Use Topics  . Smoking status: Current Every Day Smoker -- 2.0 packs/day    Types: Cigarettes  . Smokeless tobacco: Not on file  . Alcohol Use: No    OB History    Grav Para Term Preterm Abortions TAB SAB Ect Mult Living                  Review of Systems  Constitutional: Negative for fever.       10 Systems reviewed and are negative for acute change except as noted in the HPI.  HENT: Positive for dental problem. Negative for congestion.   Eyes: Negative for discharge and redness.  Respiratory: Negative for cough and shortness of breath.     Cardiovascular: Negative for chest pain.  Gastrointestinal: Negative for vomiting and abdominal pain.  Musculoskeletal: Negative for back pain.  Skin: Negative for rash.  Neurological: Negative for syncope, numbness and headaches.  Psychiatric/Behavioral:       No behavior change.    Allergies  Review of patient's allergies indicates no known allergies.  Home Medications   Current Outpatient Rx  Name Route Sig Dispense Refill  . HYDROCODONE-ACETAMINOPHEN 5-325 MG PO TABS Oral Take 1 tablet by mouth every 4 (four) hours as needed for pain. 20 tablet 0  . IBUPROFEN 200 MG PO TABS Oral Take 800 mg by mouth every 6 (six) hours as needed. Pain    . LISINOPRIL 20 MG PO TABS Oral Take 1 tablet (20 mg total) by mouth daily. 30 tablet 0  . LISINOPRIL 20 MG PO TABS Oral Take 1 tablet (20 mg total) by mouth 2 (two) times daily. 60 tablet 3  . METOPROLOL TARTRATE 25 MG PO TABS Oral Take 1 tablet (25 mg total) by mouth 2 (two) times daily. 60 tablet 3  . NAPROXEN 250 MG PO TABS Oral Take 1 tablet (250 mg total) by mouth 2 (two) times daily with a meal. 14 tablet 0    BP 187/115  Pulse 73  Temp 98.6 F (37  C) (Oral)  Resp 20  Ht 5\' 7"  (1.702 m)  Wt 155 lb (70.308 kg)  BMI 24.28 kg/m2  SpO2 99%  LMP 01/29/2012  Physical Exam  Nursing note and vitals reviewed. Constitutional: She appears well-nourished.       Awake, alert, nontoxic appearance.  HENT:  Head: Atraumatic.       Poor dentition. 1st molar of left upper jaw broken at gumline. No gum swelling. No obvious abscess.   Eyes: Right eye exhibits no discharge. Left eye exhibits no discharge.  Neck: Neck supple.  Cardiovascular: Normal heart sounds.   Pulmonary/Chest: Effort normal and breath sounds normal. She exhibits no tenderness.  Abdominal: Soft. There is no tenderness. There is no rebound.  Musculoskeletal: She exhibits no tenderness.       Baseline ROM, no obvious new focal weakness.  Neurological:       Mental status  and motor strength appears baseline for patient and situation.  Skin: No rash noted.  Psychiatric: She has a normal mood and affect.    ED Course  Procedures (including critical care time)  Labs Reviewed - No data to display No results found.   1. Pain, dental   2. Hypertension       MDM  Patient with continued dental pain from poor dentition, caries and broken teeth. Due to hypertension, dentist x 2 have been reluctant to extract teeth. Given analgesic and antibiotic. Given referral to [facility] for both primary care and dental care. Given Rx for antihypertensives x 2, antibiotic, analgesic.Pt stable in ED with no significant deterioration in condition.The patient appears reasonably screened and/or stabilized for discharge and I doubt any other medical condition or other [facility] requiring further screening, evaluation, or treatment in the ED at this time prior to discharge.  MDM Reviewed: nursing note, vitals and previous chart           Nicoletta Dress. Colon Branch, MD 09/17/12 [phone]

## 2012-09-19 ENCOUNTER — Emergency Department (HOSPITAL_COMMUNITY)
Admission: EM | Admit: 2012-09-19 | Discharge: 2012-09-19 | Disposition: A | Discharge status: Home / Self Care | Payer: Medicaid Other | Attending: Emergency Medicine | Admitting: Emergency Medicine

## 2012-09-19 ENCOUNTER — Encounter (HOSPITAL_COMMUNITY): Payer: Self-pay

## 2012-09-19 DIAGNOSIS — G8929 Other chronic pain: Secondary | ICD-10-CM | POA: Insufficient documentation

## 2012-09-19 DIAGNOSIS — L02413 Cutaneous abscess of right upper limb: Secondary | ICD-10-CM

## 2012-09-19 DIAGNOSIS — I10 Essential (primary) hypertension: Secondary | ICD-10-CM | POA: Insufficient documentation

## 2012-09-19 DIAGNOSIS — M549 Dorsalgia, unspecified: Secondary | ICD-10-CM | POA: Insufficient documentation

## 2012-09-19 DIAGNOSIS — F17200 Nicotine dependence, unspecified, uncomplicated: Secondary | ICD-10-CM | POA: Insufficient documentation

## 2012-09-19 DIAGNOSIS — IMO0002 Reserved for concepts with insufficient information to code with codable children: Secondary | ICD-10-CM | POA: Insufficient documentation

## 2012-09-19 MED ORDER — HYDROCODONE-ACETAMINOPHEN 5-325 MG PO TABS
1.0000 | ORAL_TABLET | Freq: Once | ORAL | Status: AC
  Administered 2012-09-19: 1 via ORAL
  Filled 2012-09-19: qty 1

## 2012-09-19 MED ORDER — DOXYCYCLINE HYCLATE 100 MG PO CAPS: 100.0000 mg | ORAL_CAPSULE | Freq: Two times a day (BID) | ORAL | Status: DC

## 2012-09-19 MED ORDER — LIDOCAINE HCL (PF) 1 % IJ SOLN
INTRAMUSCULAR | Status: AC
  Administered 2012-09-19: 16:00:00
  Filled 2012-09-19: qty 5

## 2012-09-19 MED ORDER — DOXYCYCLINE HYCLATE 100 MG PO TABS
100.0000 mg | ORAL_TABLET | Freq: Once | ORAL | Status: AC
  Administered 2012-09-19: 100 mg via ORAL
  Filled 2012-09-19: qty 1

## 2012-09-19 MED ORDER — HYDROCODONE-ACETAMINOPHEN 5-325 MG PO TABS: 1.0000 | ORAL_TABLET | Freq: Four times a day (QID) | ORAL | Status: AC | PRN

## 2012-09-19 NOTE — ED Provider Notes (Signed)
History     CSN: [id number]  Arrival date & time 09/19/12  1518   None     Chief Complaint  Patient presents with  . Wound Check    (Consider location/radiation/quality/duration/timing/severity/associated sxs/prior treatment) HPI Comments: Pt has area on R forearm that has grown in size, redness and PT over past 2 days.  No fever or chills.  Patient is a 41 y.o. female presenting with wound check. The history is provided by the patient. No language interpreter was used.  Wound Check  She was treated in the ED today. Previous treatment in the ED includes I&D of abscess. Her temperature was unmeasured prior to arrival.    Past Medical History  Diagnosis Date  . Hypertension   . Migraine   . Chronic back pain     Past Surgical History  Procedure Date  . Abdominal hysterectomy     No family history on file.  History  Substance Use Topics  . Smoking status: Current Every Day Smoker -- 2.0 packs/day    Types: Cigarettes  . Smokeless tobacco: Not on file  . Alcohol Use: No    OB History    Grav Para Term Preterm Abortions TAB SAB Ect Mult Living                  Review of Systems  Constitutional: Negative for fever and chills.  Skin:       Abscess   All other systems reviewed and are negative.    Allergies  Review of patient's allergies indicates no known allergies.  Home Medications   Current Outpatient Rx  Name Route Sig Dispense Refill  . DOXYCYCLINE HYCLATE 100 MG PO CAPS Oral Take 1 capsule (100 mg total) by mouth 2 (two) times daily. 20 capsule 0  . HYDROCODONE-ACETAMINOPHEN 5-325 MG PO TABS Oral Take 1 tablet by mouth every 4 (four) hours as needed for pain. 20 tablet 0  . HYDROCODONE-ACETAMINOPHEN 5-325 MG PO TABS Oral Take 1 tablet by mouth every 6 (six) hours as needed for pain. 20 tablet 0  . IBUPROFEN 200 MG PO TABS Oral Take 800 mg by mouth every 6 (six) hours as needed. Pain    . LISINOPRIL 20 MG PO TABS Oral Take 1 tablet (20 mg total) by  mouth daily. 30 tablet 0  . LISINOPRIL 20 MG PO TABS Oral Take 1 tablet (20 mg total) by mouth 2 (two) times daily. 60 tablet 3  . METOPROLOL TARTRATE 25 MG PO TABS Oral Take 1 tablet (25 mg total) by mouth 2 (two) times daily. 60 tablet 3  . NAPROXEN 250 MG PO TABS Oral Take 1 tablet (250 mg total) by mouth 2 (two) times daily with a meal. 14 tablet 0    BP 180/109  Pulse 75  Temp 98.2 F (36.8 C) (Oral)  Resp 20  Ht 5\' 7"  (1.702 m)  Wt 155 lb (70.308 kg)  BMI 24.28 kg/m2  SpO2 100%  LMP 01/29/2012  Physical Exam  Nursing note and vitals reviewed. Constitutional: She is oriented to person, place, and time. She appears well-developed and well-nourished. No distress.  HENT:  Head: Normocephalic and atraumatic.  Eyes: EOM are normal.  Neck: Normal range of motion.  Cardiovascular: Normal rate, regular rhythm and normal heart sounds.   Pulmonary/Chest: Effort normal and breath sounds normal.  Abdominal: Soft. She exhibits no distension. There is no tenderness.  Musculoskeletal: Normal range of motion. She exhibits tenderness.       Right  forearm: She exhibits tenderness and swelling. She exhibits no bony tenderness, no deformity and no laceration.       Arms: Neurological: She is alert and oriented to person, place, and time.  Skin: Skin is warm and dry.  Psychiatric: She has a normal mood and affect. Judgment normal.    ED Course  INCISION AND DRAINAGE Date/Time: 09/19/2012 3:40 PM Performed by: Evalina Field Authorized by: Evalina Field Consent: Verbal consent obtained. Written consent not obtained. Risks and benefits: risks, benefits and alternatives were discussed Consent given by: patient Patient understanding: patient states understanding of the procedure being performed Patient consent: the patient's understanding of the procedure matches consent given Site marked: the operative site was not marked Imaging studies: imaging studies not available Patient identity  confirmed: verbally with patient Time out: Immediately prior to procedure a "time out" was called to verify the correct patient, procedure, equipment, support staff and site/side marked as required. Type: abscess Body area: upper extremity (R forearm) Anesthesia: local infiltration Local anesthetic: lidocaine 2% without epinephrine Anesthetic total: 2.5 ml Patient sedated: no Scalpel size: 11 Needle gauge: 25 ga. Incision type: single straight Complexity: simple Drainage: purulent Drainage amount: moderate Wound treatment: drain placed Packing material: 1/4 in iodoform gauze (~ 3 inches of packing inserted) Patient tolerance: Patient tolerated the procedure well with no immediate complications.   (including critical care time)  Labs Reviewed - No data to display No results found.   1. Abscess of right forearm       MDM  rx-doxycycline, 20 rx-hydrocodone, 20 Ibuprofen 800 mg TID Warm compresses.  Remove packing in 5 days.  F/y at [facility] prn        Evalina Field, PA 09/19/12 1609

## 2012-09-19 NOTE — ED Notes (Signed)
Pt has abcess to right forearm for 3 days, quarter size area noted. No drainage noted.

## 2012-09-19 NOTE — ED Provider Notes (Signed)
Medical screening examination/treatment/procedure(s) were performed by non-physician practitioner and as supervising physician I was immediately available for consultation/collaboration.  Leannah Guse, MD 09/19/12 2116 

## 2015-12-09 ENCOUNTER — Encounter (HOSPITAL_COMMUNITY): Payer: Self-pay | Admitting: Emergency Medicine

## 2015-12-09 ENCOUNTER — Emergency Department (HOSPITAL_COMMUNITY)
Admission: EM | Admit: 2015-12-09 | Discharge: 2015-12-09 | Disposition: A | Discharge status: Home / Self Care | Payer: Medicaid Other | Attending: Emergency Medicine | Admitting: Emergency Medicine

## 2015-12-09 ENCOUNTER — Emergency Department (HOSPITAL_COMMUNITY): Payer: Medicaid Other

## 2015-12-09 DIAGNOSIS — G8929 Other chronic pain: Secondary | ICD-10-CM | POA: Insufficient documentation

## 2015-12-09 DIAGNOSIS — F17210 Nicotine dependence, cigarettes, uncomplicated: Secondary | ICD-10-CM | POA: Insufficient documentation

## 2015-12-09 DIAGNOSIS — G43909 Migraine, unspecified, not intractable, without status migrainosus: Secondary | ICD-10-CM | POA: Insufficient documentation

## 2015-12-09 DIAGNOSIS — Z792 Long term (current) use of antibiotics: Secondary | ICD-10-CM | POA: Insufficient documentation

## 2015-12-09 DIAGNOSIS — Z79899 Other long term (current) drug therapy: Secondary | ICD-10-CM | POA: Insufficient documentation

## 2015-12-09 DIAGNOSIS — J209 Acute bronchitis, unspecified: Secondary | ICD-10-CM | POA: Insufficient documentation

## 2015-12-09 DIAGNOSIS — I10 Essential (primary) hypertension: Secondary | ICD-10-CM | POA: Insufficient documentation

## 2015-12-09 DIAGNOSIS — R630 Anorexia: Secondary | ICD-10-CM | POA: Insufficient documentation

## 2015-12-09 LAB — I-STAT CHEM 8, ED
BUN: 6 mg/dL (ref 6–20)
CREATININE: 0.6 mg/dL (ref 0.44–1.00)
Calcium, Ion: 1.15 mmol/L (ref 1.12–1.23)
Chloride: 107 mmol/L (ref 101–111)
GLUCOSE: 132 mg/dL — AB (ref 65–99)
HEMATOCRIT: 43 % (ref 36.0–46.0)
Hemoglobin: 14.6 g/dL (ref 12.0–15.0)
POTASSIUM: 3.1 mmol/L — AB (ref 3.5–5.1)
Sodium: 144 mmol/L (ref 135–145)
TCO2: 24 mmol/L (ref 0–100)

## 2015-12-09 LAB — CBC WITH DIFFERENTIAL/PLATELET
Basophils Absolute: 0 10*3/uL (ref 0.0–0.1)
Basophils Relative: 0 %
EOS PCT: 0 %
Eosinophils Absolute: 0 10*3/uL (ref 0.0–0.7)
HEMATOCRIT: 40.3 % (ref 36.0–46.0)
Hemoglobin: 13.3 g/dL (ref 12.0–15.0)
LYMPHS PCT: 23 %
Lymphs Abs: 2 10*3/uL (ref 0.7–4.0)
MCH: 29.9 pg (ref 26.0–34.0)
MCHC: 33 g/dL (ref 30.0–36.0)
MCV: 90.6 fL (ref 78.0–100.0)
MONO ABS: 0.5 10*3/uL (ref 0.1–1.0)
MONOS PCT: 6 %
NEUTROS ABS: 6.1 10*3/uL (ref 1.7–7.7)
Neutrophils Relative %: 71 %
Platelets: 339 10*3/uL (ref 150–400)
RBC: 4.45 MIL/uL (ref 3.87–5.11)
RDW: 14.3 % (ref 11.5–15.5)
WBC: 8.7 10*3/uL (ref 4.0–10.5)

## 2015-12-09 MED ORDER — AEROCHAMBER PLUS W/MASK MISC
1.0000 | Freq: Once | Status: DC
  Filled 2015-12-09: qty 1

## 2015-12-09 MED ORDER — POTASSIUM CHLORIDE CRYS ER 20 MEQ PO TBCR
40.0000 meq | EXTENDED_RELEASE_TABLET | Freq: Once | ORAL | Status: AC
  Administered 2015-12-09: 40 meq via ORAL
  Filled 2015-12-09: qty 2

## 2015-12-09 MED ORDER — ALBUTEROL SULFATE (2.5 MG/3ML) 0.083% IN NEBU
5.0000 mg | INHALATION_SOLUTION | Freq: Once | RESPIRATORY_TRACT | Status: AC
  Administered 2015-12-09: 5 mg via RESPIRATORY_TRACT
  Filled 2015-12-09: qty 6

## 2015-12-09 MED ORDER — SODIUM CHLORIDE 0.9 % IV BOLUS (SEPSIS): 2000.0000 mL | Freq: Once | INTRAVENOUS | Status: DC

## 2015-12-09 MED ORDER — DOXYCYCLINE HYCLATE 100 MG PO CAPS: 100.0000 mg | ORAL_CAPSULE | Freq: Two times a day (BID) | ORAL | Status: AC

## 2015-12-09 MED ORDER — LISINOPRIL 10 MG PO TABS: 5.0000 mg | ORAL_TABLET | Freq: Every day | ORAL | Status: AC

## 2015-12-09 MED ORDER — ALBUTEROL SULFATE HFA 108 (90 BASE) MCG/ACT IN AERS
2.0000 | INHALATION_SPRAY | RESPIRATORY_TRACT | Status: DC | PRN
  Administered 2015-12-09: 2 via RESPIRATORY_TRACT
  Filled 2015-12-09: qty 6.7

## 2015-12-09 NOTE — ED Notes (Addendum)
PT c/o congested productive green sputum cough x1 week and stated recent tx with steroids for bronchitis and sinus infection. PT also stated low po intake and generlized weakness.

## 2015-12-09 NOTE — Discharge Instructions (Signed)
Acute Bronchitis Use your albuterol inhaler 2 puffs every 4 hours as needed for cough or shortness of breath. Return if needed more than every 4 hours or go to another emergency department. Arrange to get a primary care physician in [place] within the next 1 or 2 weeks. Ask your new primary care physician to ask you to help you to stop smoking .Your blood pressure should be rechecked within the next 2 weeks. Today's was elevated at 161/113. Bronchitis is inflammation of the airways that extend from the windpipe into the lungs (bronchi). The inflammation often causes mucus to develop. This leads to a cough, which is the most common symptom of bronchitis.  In acute bronchitis, the condition usually develops suddenly and goes away over time, usually in a couple weeks. Smoking, allergies, and asthma can make bronchitis worse. Repeated episodes of bronchitis may cause further lung problems.  CAUSES Acute bronchitis is most often caused by the same virus that causes a cold. The virus can spread from person to person (contagious) through coughing, sneezing, and touching contaminated objects. SIGNS AND SYMPTOMS   Cough.   Fever.   Coughing up mucus.   Body aches.   Chest congestion.   Chills.   Shortness of breath.   Sore throat.  DIAGNOSIS  Acute bronchitis is usually diagnosed through a physical exam. Your health care provider will also ask you questions about your medical history. Tests, such as chest X-rays, are sometimes done to rule out other conditions.  TREATMENT  Acute bronchitis usually goes away in a couple weeks. Oftentimes, no medical treatment is necessary. Medicines are sometimes given for relief of fever or cough. Antibiotic medicines are usually not needed but may be prescribed in certain situations. In some cases, an inhaler may be recommended to help reduce shortness of breath and control the cough. A cool mist vaporizer may also be used to help thin bronchial secretions  and make it easier to clear the chest.  HOME CARE INSTRUCTIONS  Get plenty of rest.   Drink enough fluids to keep your urine clear or pale yellow (unless you have a medical condition that requires fluid restriction). Increasing fluids may help thin your respiratory secretions (sputum) and reduce chest congestion, and it will prevent dehydration.   Take medicines only as directed by your health care provider.  If you were prescribed an antibiotic medicine, finish it all even if you start to feel better.  Avoid smoking and secondhand smoke. Exposure to cigarette smoke or irritating chemicals will make bronchitis worse. If you are a smoker, consider using nicotine gum or skin patches to help control withdrawal symptoms. Quitting smoking will help your lungs heal faster.   Reduce the chances of another bout of acute bronchitis by washing your hands frequently, avoiding people with cold symptoms, and trying not to touch your hands to your mouth, nose, or eyes.   Keep all follow-up visits as directed by your health care provider.  SEEK MEDICAL CARE IF: Your symptoms do not improve after 1 week of treatment.  SEEK IMMEDIATE MEDICAL CARE IF:  You develop an increased fever or chills.   You have chest pain.   You have severe shortness of breath.  You have bloody sputum.   You develop dehydration.  You faint or repeatedly feel like you are going to pass out.  You develop repeated vomiting.  You develop a severe headache. MAKE SURE YOU:   Understand these instructions.  Will watch your condition.  Will get help right  away if you are not doing well or get worse.   This information is not intended to replace advice given to you by your health care provider. Make sure you discuss any questions you have with your health care provider.   Document Released: 01/04/2005 Document Revised: 12/18/2014 Document Reviewed: 05/20/2013 Elsevier Interactive Patient Education [employer]2016 [employer]

## 2015-12-09 NOTE — ED Notes (Signed)
Pt refused aerochamber. Stated she had used an inhaler in the past

## 2015-12-09 NOTE — ED Provider Notes (Signed)
CSN: [id number]     Arrival date & time 12/09/15  1200 History   First MD Initiated Contact with Patient 12/09/15 1301     Chief Complaint  Patient presents with  . Cough     (Consider location/radiation/quality/duration/timing/severity/associated sxs/prior Treatment) HPI Complains of productive cough green sputum for the past 3 weeks. She states "it all started with sinus congestion" She was seen in emergency department in [facility] prescribed prednisone 3 weeks ago which she's taken, without relief. She also tried amoxicillin from a friend's prescription yesterday. Has taken 2 doses She denies fever. Admits to diminished appetite. No nausea or vomiting. No other associated symptoms . She's been noncompliant with antihypertensives for several years Past Medical History  Diagnosis Date  . Hypertension   . Migraine   . Chronic back pain    Past Surgical History  Procedure Laterality Date  . Abdominal hysterectomy     History reviewed. No pertinent family history. Social History  Substance Use Topics  . Smoking status: Current Every Day Smoker -- 2.00 packs/day    Types: Cigarettes  . Smokeless tobacco: None  . Alcohol Use: No   OB History    No data available     Review of Systems  Constitutional: Positive for appetite change.  HENT: Positive for congestion.   Respiratory: Positive for cough.   Cardiovascular: Negative.   Gastrointestinal: Negative.   Musculoskeletal: Negative.   Skin: Negative.   Neurological: Negative.   Psychiatric/Behavioral: Negative.   All other systems reviewed and are negative.     Allergies  Review of patient's allergies indicates no known allergies.  Home Medications   Prior to Admission medications   Medication Sig Start Date End Date Taking? Authorizing Provider  doxycycline (VIBRAMYCIN) 100 MG capsule Take 1 capsule (100 mg total) by mouth 2 (two) times daily. 09/19/12   Richard Paul Half, PA-C  ibuprofen  (ADVIL,MOTRIN) 200 MG tablet Take 800 mg by mouth every 6 (six) hours as needed. Pain    Historical Provider, MD  lisinopril (PRINIVIL,ZESTRIL) 20 MG tablet Take 1 tablet (20 mg total) by mouth daily. 05/18/12 05/18/13  Burgess Amor, PA-C  lisinopril (PRINIVIL,ZESTRIL) 20 MG tablet Take 1 tablet (20 mg total) by mouth 2 (two) times daily. 09/17/12   Annamarie Dawley, MD  metoprolol tartrate (LOPRESSOR) 25 MG tablet Take 1 tablet (25 mg total) by mouth 2 (two) times daily. 09/17/12   Annamarie Dawley, MD   BP 166/113 mmHg  Pulse 75  Temp(Src) 98.4 F (36.9 C)  Resp 24  Ht  (1.702 m)  Wt 160 lb (72.576 kg)  BMI 25.05 kg/m2  SpO2 93%  LMP 01/29/2012 Physical Exam  Constitutional: She appears well-developed and well-nourished.  HENT:  Head: Normocephalic and atraumatic.  Eyes: Conjunctivae are normal. Pupils are equal, round, and reactive to light.  Neck: Neck supple. No tracheal deviation present. No thyromegaly present.  Cardiovascular: Normal rate and regular rhythm.   No murmur heard. Pulmonary/Chest: Effort normal. She has wheezes.  Expiratory wheezes  Abdominal: Soft. Bowel sounds are normal. She exhibits no distension. There is no tenderness.  Musculoskeletal: Normal range of motion. She exhibits no edema or tenderness.  Neurological: She is alert. Coordination normal.  Skin: Skin is warm and dry. No rash noted.  Psychiatric: She has a normal mood and affect.  Nursing note and vitals reviewed.   ED Course  Procedures (including critical care time) Labs Review Labs Reviewed  I-STAT CHEM 8, ED  Imaging Review No results found. I have personally reviewed and evaluated these images and lab results as part of my medical decision-making.   EKG Interpretation None     4 PM breathing improved after nebulizer treatment Chest x-ray viewed by me Results for orders placed or performed during the hospital encounter of 12/09/15  CBC with Differential/Platelet  Result Value Ref  Range   WBC 8.7 4.0 - 10.5 K/uL   RBC 4.45 3.87 - 5.11 MIL/uL   Hemoglobin 13.3 12.0 - 15.0 g/dL   HCT 16.140.3 09.636.0 - 04.546.0 %   MCV 90.6 78.0 - 100.0 fL   MCH 29.9 26.0 - 34.0 pg   MCHC 33.0 30.0 - 36.0 g/dL   RDW 40.914.3 81.111.5 - 91.415.5 %   Platelets 339 150 - 400 K/uL   Neutrophils Relative % 71 %   Neutro Abs 6.1 1.7 - 7.7 K/uL   Lymphocytes Relative 23 %   Lymphs Abs 2.0 0.7 - 4.0 K/uL   Monocytes Relative 6 %   Monocytes Absolute 0.5 0.1 - 1.0 K/uL   Eosinophils Relative 0 %   Eosinophils Absolute 0.0 0.0 - 0.7 K/uL   Basophils Relative 0 %   Basophils Absolute 0.0 0.0 - 0.1 K/uL  I-stat chem 8, ed  Result Value Ref Range   Sodium 144 135 - 145 mmol/L   Potassium 3.1 (L) 3.5 - 5.1 mmol/L   Chloride 107 101 - 111 mmol/L   BUN 6 6 - 20 mg/dL   Creatinine, Ser 7.820.60 0.44 - 1.00 mg/dL   Glucose, Bld 956132 (H) 65 - 99 mg/dL   Calcium, Ion 2.131.15 0.861.12 - 1.23 mmol/L   TCO2 24 0 - 100 mmol/L   Hemoglobin 14.6 12.0 - 15.0 g/dL   HCT 57.843.0 46.936.0 - 62.946.0 %   Dg Chest 2 View  12/09/2015  CLINICAL DATA:  44 year old female with history productive cough, congestion and shortness of breath for the past 3 weeks. EXAM: CHEST  2 VIEW COMPARISON:  Chest x-ray 04/14/2009. FINDINGS: New nodular density projecting over the lower left hemithorax seen only on the frontal projection, which appears to correspond to the nipple on the lateral projection. Lungs are otherwise clear. No pleural effusions. No evidence of pulmonary edema. Heart size is normal. Upper mediastinal contours are within normal limits. Atherosclerosis in the thoracic aorta. IMPRESSION: 1. No radiographic evidence of acute cardiopulmonary disease. 2. Atherosclerosis. 3. Nodular density projecting over the lower left hemithorax on the frontal projection corresponds to the nipple on the lateral view. Electronically Signed   By: Trudie Reedaniel  Entrikin M.D.   On: 12/09/2015 13:25    MDM  Patient not lightheaded on standing. I don't feel that she needs  intravenous fluids. I counseled patient for 5 minutes on smoking cessation. Will write prescription for  Doxycycline in light of prolonged symptoms and smoking history. Rx lisinopril. Blood pressure recheck 1-2 weeks Albuterol HFA with spacer to go to use 2 puffs every 4 hours when necessary shortness of breath  Diagnosis #1 acute bronchitis #2 tobacco abuse #3 hypertension #4 medication noncompliance #4 hypokalemia Final diagnoses:  None        Doug SouSam Saathvik Every, MD 12/09/15 1607

## 2019-08-11 ENCOUNTER — Other Ambulatory Visit: Payer: Self-pay

## 2019-08-11 ENCOUNTER — Emergency Department (HOSPITAL_COMMUNITY)
Admission: EM | Admit: 2019-08-11 | Discharge: 2019-08-12 | Disposition: A | Discharge status: Home / Self Care | Payer: No Typology Code available for payment source | Attending: Emergency Medicine | Admitting: Emergency Medicine

## 2019-08-11 ENCOUNTER — Encounter (HOSPITAL_COMMUNITY): Payer: Self-pay | Admitting: *Deleted

## 2019-08-11 DIAGNOSIS — I10 Essential (primary) hypertension: Secondary | ICD-10-CM | POA: Diagnosis not present

## 2019-08-11 DIAGNOSIS — Z79899 Other long term (current) drug therapy: Secondary | ICD-10-CM | POA: Insufficient documentation

## 2019-08-11 DIAGNOSIS — R4781 Slurred speech: Secondary | ICD-10-CM | POA: Diagnosis not present

## 2019-08-11 DIAGNOSIS — F1910 Other psychoactive substance abuse, uncomplicated: Secondary | ICD-10-CM | POA: Diagnosis not present

## 2019-08-11 DIAGNOSIS — F1110 Opioid abuse, uncomplicated: Secondary | ICD-10-CM | POA: Diagnosis not present

## 2019-08-11 DIAGNOSIS — F17210 Nicotine dependence, cigarettes, uncomplicated: Secondary | ICD-10-CM | POA: Diagnosis not present

## 2019-08-11 HISTORY — DX: Opioid abuse, uncomplicated: F11.10

## 2019-08-11 NOTE — ED Triage Notes (Signed)
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

## 2019-08-12 ENCOUNTER — Emergency Department (HOSPITAL_COMMUNITY): Payer: No Typology Code available for payment source

## 2019-08-12 LAB — ETHANOL: Alcohol, Ethyl (B): <10 mg/dL (ref <10)

## 2019-08-12 LAB — URINALYSIS, ROUTINE W REFLEX MICROSCOPIC
Bacteria, UA: NONE SEEN
Bilirubin Urine: NEGATIVE
Glucose, UA: NEGATIVE mg/dL
Ketones, ur: 5 mg/dL — AB
Leukocytes,Ua: NEGATIVE
Nitrite: NEGATIVE
Protein, ur: 30 mg/dL — AB
Specific Gravity, Urine: 1.009 (ref 1.005–1.030)
pH: 6 (ref 5.0–8.0)

## 2019-08-12 LAB — CBC
HCT: 38.2 % (ref 36.0–46.0)
Hemoglobin: 12 g/dL (ref 12.0–15.0)
MCH: 31.3 pg (ref 26.0–34.0)
MCHC: 31.4 g/dL (ref 30.0–36.0)
MCV: 99.7 fL (ref 80.0–100.0)
Platelets: 214 10*3/uL (ref 150–400)
RBC: 3.83 MIL/uL — ABNORMAL LOW (ref 3.87–5.11)
RDW: 14.6 % (ref 11.5–15.5)
WBC: 13.2 10*3/uL — ABNORMAL HIGH (ref 4.0–10.5)
nRBC: 0 % (ref 0.0–0.2)

## 2019-08-12 LAB — COMPREHENSIVE METABOLIC PANEL
ALT: 30 U/L (ref 0–44)
AST: 28 U/L (ref 15–41)
Albumin: 3.8 g/dL (ref 3.5–5.0)
Alkaline Phosphatase: 106 U/L (ref 38–126)
Anion gap: 13 (ref 5–15)
BUN: 25 mg/dL — ABNORMAL HIGH (ref 6–20)
CO2: 22 mmol/L (ref 22–32)
Calcium: 8.9 mg/dL (ref 8.9–10.3)
Chloride: 105 mmol/L (ref 98–111)
Creatinine, Ser: 1.68 mg/dL — ABNORMAL HIGH (ref 0.44–1.00)
GFR calc Af Amer: 42 mL/min — ABNORMAL LOW (ref >60)
GFR calc non Af Amer: 36 mL/min — ABNORMAL LOW (ref >60)
Glucose, Bld: 96 mg/dL (ref 70–99)
Potassium: 4.4 mmol/L (ref 3.5–5.1)
Sodium: 140 mmol/L (ref 135–145)
Total Bilirubin: 0.8 mg/dL (ref 0.3–1.2)
Total Protein: 7.6 g/dL (ref 6.5–8.1)

## 2019-08-12 LAB — RAPID URINE DRUG SCREEN, HOSP PERFORMED
Amphetamines: NOT DETECTED
Barbiturates: NOT DETECTED
Benzodiazepines: NOT DETECTED
Cocaine: POSITIVE — AB
Opiates: POSITIVE — AB
Tetrahydrocannabinol: NOT DETECTED

## 2019-08-12 LAB — SALICYLATE LEVEL: Salicylate Lvl: <7 mg/dL (ref 2.8–30.0)

## 2019-08-12 LAB — PREGNANCY, URINE: Preg Test, Ur: NEGATIVE

## 2019-08-12 LAB — ACETAMINOPHEN LEVEL: Acetaminophen (Tylenol), Serum: <10 ug/mL — ABNORMAL LOW (ref 10–30)

## 2019-08-12 NOTE — Discharge Instructions (Addendum)
Stop using cocaine and heroin. Followup with the detox resources given. Your kidney function is slightly elevated and you should followup with a primary doctor regarding this.  Return to the ED if you develop chest pain, shortness of breath, thoughts of wanting to hurt yourself or any other concerns.

## 2019-08-12 NOTE — ED Notes (Signed)
Pt ambulatory in treatment room with no difficulty or distress noted.

## 2019-08-12 NOTE — ED Provider Notes (Signed)
[facility] EMERGENCY DEPARTMENT Provider Note   CSN: [id number] Arrival date & time: 08/11/19  2308     History   Chief Complaint Chief Complaint  Patient presents with   detox    HPI Molly Munoz is a 48 y.o. female.     Level 5 caveat for altered mental status.  Patient brought in by EMS after apparent heroin overdose.  Patient's daughter called when she found the patient asleep in bed with a needle sticking out of her foot and had difficulty waking up.  She did not receive any Narcan.  She is now awake and alert.  Patient states she has had difficulty sleeping for the past several days.  She admits to using heroin on a daily basis by IV route.  She also used crack today.  She denies taking any pills or any prescription medications.  She denies any ethanol use.  She denies any headache, chest pain, shortness of breath, abdominal pain, nausea or vomiting.  She states she is not suicidal homicidal.  She is not hearing any voices. Mildly slurred speech.  Denies any headache.  She is moving all of her extremities appropriately.  The history is provided by the patient.    Past Medical History:  Diagnosis Date   Chronic back pain    Heroin abuse ([place])    Hypertension    Migraine     There are no active problems to display for this patient.   Past Surgical History:  Procedure Laterality Date   ABDOMINAL HYSTERECTOMY       OB History   No obstetric history on file.      Home Medications    Prior to Admission medications   Medication Sig Start Date End Date Taking? Authorizing Provider  doxycycline (VIBRAMYCIN) 100 MG capsule Take 1 capsule (100 mg total) by mouth 2 (two) times daily. One po bid x 7 days 12/09/15   Orlie Dakin, MD  lisinopril (PRINIVIL,ZESTRIL) 10 MG tablet Take 0.5 tablets (5 mg total) by mouth daily. 12/09/15   Orlie Dakin, MD    Family History History reviewed. No pertinent family history.  Social History Social History    Tobacco Use   Smoking status: Current Every Day Smoker    Packs/day: 1.00    Types: Cigarettes  Substance Use Topics   Alcohol use: No   Drug use: Yes    Types: IV    Comment: heroin     Allergies   Suboxone [buprenorphine hcl-naloxone hcl]   Review of Systems Review of Systems  Constitutional: Positive for fatigue. Negative for activity change, appetite change and fever.  HENT: Negative for congestion and rhinorrhea.   Respiratory: Negative for cough, chest tightness and shortness of breath.   Cardiovascular: Negative for chest pain.  Gastrointestinal: Negative for abdominal pain, nausea and vomiting.  Genitourinary: Negative for dysuria and hematuria.  Musculoskeletal: Negative for arthralgias and myalgias.  Neurological: Positive for weakness. Negative for dizziness and headaches.  Psychiatric/Behavioral: Positive for sleep disturbance. Negative for suicidal ideas.    all other systems are negative except as noted in the HPI and PMH.    Physical Exam Updated Vital Signs BP (!) 169/118 (BP Location: Right Arm)    Pulse 94    Temp 98.3 F (36.8 C) (Oral)    Resp (!) 22    Ht 5\' 7"  (1.702 m)    Wt 70.1 kg    LMP 01/29/2012    SpO2 95%    BMI 24.21 kg/m  Physical Exam Vitals signs and nursing note reviewed.  Constitutional:      General: She is not in acute distress.    Appearance: She is well-developed. She is not ill-appearing.     Comments: Mildly slurred speech, oriented to person and place.  HENT:     Head: Normocephalic and atraumatic.     Mouth/Throat:     Pharynx: No oropharyngeal exudate.     Comments: Poor dentition throughout Eyes:     Conjunctiva/sclera: Conjunctivae normal.     Pupils: Pupils are equal, round, and reactive to light.     Comments: Pupils 3 mm bilaterally  Neck:     Musculoskeletal: Normal range of motion and neck supple.     Comments: No meningismus. Cardiovascular:     Rate and Rhythm: Normal rate and regular rhythm.      Heart sounds: Normal heart sounds. No murmur.  Pulmonary:     Effort: Pulmonary effort is normal. No respiratory distress.     Breath sounds: Normal breath sounds.  Chest:     Chest wall: No tenderness.  Abdominal:     Palpations: Abdomen is soft.     Tenderness: There is no abdominal tenderness. There is no guarding or rebound.  Musculoskeletal: Normal range of motion.        General: No tenderness.     Comments: Multiple track marks  Skin:    General: Skin is warm.     Capillary Refill: Capillary refill takes less than 2 seconds.  Neurological:     General: No focal deficit present.     Mental Status: She is alert and oriented to person, place, and time. Mental status is at baseline.     Cranial Nerves: No cranial nerve deficit.     Motor: No abnormal muscle tone.     Coordination: Coordination normal.     Comments: No ataxia on finger to nose bilaterally. No pronator drift. 5/5 strength throughout. CN 2-12 intact.Equal grip strength. Sensation intact.   Psychiatric:        Behavior: Behavior normal.      ED Treatments / Results  Labs (all labs ordered are listed, but only abnormal results are displayed) Labs Reviewed  COMPREHENSIVE METABOLIC PANEL - Abnormal; Notable for the following components:      Result Value   BUN 25 (*)    Creatinine, Ser 1.68 (*)    GFR calc non Af Amer 36 (*)    GFR calc Af Amer 42 (*)    All other components within normal limits  ACETAMINOPHEN LEVEL - Abnormal; Notable for the following components:   Acetaminophen (Tylenol), Serum <10 (*)    All other components within normal limits  CBC - Abnormal; Notable for the following components:   WBC 13.2 (*)    RBC 3.83 (*)    All other components within normal limits  RAPID URINE DRUG SCREEN, HOSP PERFORMED - Abnormal; Notable for the following components:   Opiates POSITIVE (*)    Cocaine POSITIVE (*)    All other components within normal limits  URINALYSIS, ROUTINE W REFLEX MICROSCOPIC -  Abnormal; Notable for the following components:   Hgb urine dipstick SMALL (*)    Ketones, ur 5 (*)    Protein, ur 30 (*)    All other components within normal limits  ETHANOL  SALICYLATE LEVEL  PREGNANCY, URINE    EKG EKG Interpretation  Date/Time:  Tuesday August 12 2019 00:53:30 EDT Ventricular Rate:  62 PR Interval:  QRS Duration: 109 QT Interval:  422 QTC Calculation: 429 R Axis:   57 Text Interpretation:  Sinus rhythm RSR' in V1 or V2, probably normal variant No previous ECGs available Confirmed by Glynn Octaveancour, Jalene Lacko [phone]([id number]) on 08/12/2019 1:10:53 AM   Radiology Ct Head Wo Contrast  Result Date: 08/12/2019 CLINICAL DATA:  Initial evaluation for possible overdose. EXAM: CT HEAD WITHOUT CONTRAST TECHNIQUE: Contiguous axial images were obtained from the base of the skull through the vertex without intravenous contrast. COMPARISON:  None available. FINDINGS: Brain: Cerebral volume within normal limits for patient age. Mild patchy hypodensity involving the supratentorial cerebral white matter, nonspecific, but most commonly related to chronic microvascular ischemic disease. No evidence for acute intracranial hemorrhage. No findings to suggest acute large vessel territory infarct. No mass lesion, midline shift, or mass effect. Ventricles are normal in size without evidence for hydrocephalus. No extra-axial fluid collection identified. Cerebellar tonsillar ectopia without frank Chiari malformation. Vascular: No hyperdense vessel identified. Skull: Scalp soft tissues demonstrate no acute abnormality. Calvarium intact. Sinuses/Orbits: Globes and orbital soft tissues within normal limits. Visualized paranasal sinuses are clear. No mastoid effusion. IMPRESSION: 1. No acute intracranial abnormality. 2. Mild patchy hypodensity involving the supratentorial cerebral white matter, nonspecific, but most commonly related to chronic microvascular ischemic disease. Electronically Signed   By: Rise MuBenjamin   McClintock M.D.   On: 08/12/2019 02:37    Procedures Procedures (including critical care time)  Medications Ordered in ED Medications - No data to display   Initial Impression / Assessment and Plan / ED Course  I have reviewed the triage vital signs and the nursing notes.  Pertinent labs & imaging results that were available during my care of the patient were reviewed by me and considered in my medical decision making (see chart for details).       Patient here after heroin abuse.  She did not receive Narcan.  She is awake and alert and in no distress.  She denies any pain.  States she is not suicidal or homicidal.  Drug screen positive for opiates and cocaine. Creatinine 1.63 which is elevated from values in 2016.  Patient given p.o. fluids.  CT head is negative for acute pathology.  Patient remained sleeping in the ED without need for Narcan.  Respiratory rate and oxygenation are stable.  At 6 AM, patient is awake and alert.  She is tolerating p.o.  She denies any suicidal thoughts or homicidal thoughts.  Denies any hallucinations.  She is counseled to cease using illicit drugs.  She is given a list of detox resources.  She is able to be discharged when she has a sober ride.  BP 96/68    Pulse 64    Temp 98.3 F (36.8 C) (Oral)    Resp 11    Ht 5\' 7"  (1.702 m)    Wt 70.1 kg    LMP 01/29/2012    SpO2 95%    BMI 24.21 kg/m   Final Clinical Impressions(s) / ED Diagnoses   Final diagnoses:  Polysubstance abuse (HCC)  Heroin abuse [facility](HCC)    ED Discharge Orders    None       Glynn Octaveancour, Deliana Avalos, MD 08/12/19 [phone]

## 2020-09-03 ENCOUNTER — Emergency Department (HOSPITAL_COMMUNITY)
Admission: EM | Admit: 2020-09-03 | Discharge: 2020-09-04 | Disposition: A | Discharge status: Home / Self Care | Payer: Medicaid Other | Attending: Emergency Medicine | Admitting: Emergency Medicine

## 2020-09-03 ENCOUNTER — Other Ambulatory Visit: Payer: Self-pay

## 2020-09-03 ENCOUNTER — Encounter (HOSPITAL_COMMUNITY): Payer: Self-pay | Admitting: *Deleted

## 2020-09-03 DIAGNOSIS — R1031 Right lower quadrant pain: Secondary | ICD-10-CM | POA: Diagnosis not present

## 2020-09-03 DIAGNOSIS — Z5321 Procedure and treatment not carried out due to patient leaving prior to being seen by health care provider: Secondary | ICD-10-CM | POA: Diagnosis not present

## 2020-09-03 LAB — CBC
HCT: 29.9 % — ABNORMAL LOW (ref 36.0–46.0)
Hemoglobin: 9 g/dL — ABNORMAL LOW (ref 12.0–15.0)
MCH: 27.6 pg (ref 26.0–34.0)
MCHC: 30.1 g/dL (ref 30.0–36.0)
MCV: 91.7 fL (ref 80.0–100.0)
Platelets: 406 10*3/uL — ABNORMAL HIGH (ref 150–400)
RBC: 3.26 MIL/uL — ABNORMAL LOW (ref 3.87–5.11)
RDW: 14.8 % (ref 11.5–15.5)
WBC: 16.7 10*3/uL — ABNORMAL HIGH (ref 4.0–10.5)
nRBC: 0 % (ref 0.0–0.2)

## 2020-09-03 LAB — COMPREHENSIVE METABOLIC PANEL
ALT: 56 U/L — ABNORMAL HIGH (ref 0–44)
AST: 93 U/L — ABNORMAL HIGH (ref 15–41)
Albumin: 2.5 g/dL — ABNORMAL LOW (ref 3.5–5.0)
Alkaline Phosphatase: 179 U/L — ABNORMAL HIGH (ref 38–126)
Anion gap: 13 (ref 5–15)
BUN: 11 mg/dL (ref 6–20)
CO2: 23 mmol/L (ref 22–32)
Calcium: 8.8 mg/dL — ABNORMAL LOW (ref 8.9–10.3)
Chloride: 97 mmol/L — ABNORMAL LOW (ref 98–111)
Creatinine, Ser: 1.05 mg/dL — ABNORMAL HIGH (ref 0.44–1.00)
GFR calc Af Amer: >60 mL/min (ref >60)
GFR calc non Af Amer: >60 mL/min (ref >60)
Glucose, Bld: 125 mg/dL — ABNORMAL HIGH (ref 70–99)
Potassium: 3.8 mmol/L (ref 3.5–5.1)
Sodium: 133 mmol/L — ABNORMAL LOW (ref 135–145)
Total Bilirubin: 1 mg/dL (ref 0.3–1.2)
Total Protein: 7.5 g/dL (ref 6.5–8.1)

## 2020-09-03 LAB — URINALYSIS, ROUTINE W REFLEX MICROSCOPIC
Glucose, UA: NEGATIVE mg/dL
Hgb urine dipstick: NEGATIVE
Ketones, ur: NEGATIVE mg/dL
Leukocytes,Ua: NEGATIVE
Nitrite: NEGATIVE
Protein, ur: 100 mg/dL — AB
Specific Gravity, Urine: 1.021 (ref 1.005–1.030)
pH: 5 (ref 5.0–8.0)

## 2020-09-03 LAB — LIPASE, BLOOD: Lipase: 22 U/L (ref 11–51)

## 2020-09-03 NOTE — ED Triage Notes (Signed)
To ED for eval of right side lower abd pain for past 3 days. Pt states it feels better now. Taking otc meds. Denies diarrhea. Denies difficulty with urination. States she thinks she slept wrong. Denies fevers. Appears in nad. Pain is pinpoint and non-radiating.

## 2020-09-04 NOTE — ED Notes (Signed)
Pt did not respond for vitals check X3 and is not outside lobby

## 2024-02-19 ENCOUNTER — Other Ambulatory Visit: Payer: Self-pay

## 2024-02-19 ENCOUNTER — Emergency Department (HOSPITAL_COMMUNITY)
Admission: EM | Admit: 2024-02-19 | Discharge: 2024-02-19 | Discharge status: Against Medical Advice | Payer: Self-pay | Attending: Emergency Medicine | Admitting: Emergency Medicine

## 2024-02-19 ENCOUNTER — Encounter (HOSPITAL_COMMUNITY): Payer: Self-pay | Admitting: Emergency Medicine

## 2024-02-19 DIAGNOSIS — Z5321 Procedure and treatment not carried out due to patient leaving prior to being seen by health care provider: Secondary | ICD-10-CM | POA: Diagnosis not present

## 2024-02-19 DIAGNOSIS — L0211 Cutaneous abscess of neck: Secondary | ICD-10-CM | POA: Insufficient documentation

## 2024-02-19 DIAGNOSIS — L539 Erythematous condition, unspecified: Secondary | ICD-10-CM | POA: Diagnosis present

## 2024-02-19 NOTE — ED Triage Notes (Signed)
 Pt presents with an abscess to the left carotid from IV drug use. Pt states abscess has been there for 4 days. She has attempted to treat with a heat pack. Pt is drowsy in in triage. Abscess is red, warm, swollen, oozing puss and painful.

## 2024-02-19 NOTE — ED Notes (Signed)
 Lab states they have called the patient 5 times with no answer

## 2024-06-25 ENCOUNTER — Ambulatory Visit: Payer: Self-pay
